# Patient Record
Sex: Female | Born: 1960 | Race: Black or African American | Hispanic: No | Marital: Married | Smoking: Never smoker
Health system: Southern US, Community
[De-identification: ages and names within clinical notes are randomized; demographics above are authoritative.]

## PROBLEM LIST (undated history)

## (undated) DIAGNOSIS — T7840XA Allergy, unspecified, initial encounter: Secondary | ICD-10-CM

## (undated) DIAGNOSIS — M797 Fibromyalgia: Secondary | ICD-10-CM

## (undated) DIAGNOSIS — H269 Unspecified cataract: Secondary | ICD-10-CM

## (undated) DIAGNOSIS — M064 Inflammatory polyarthropathy: Secondary | ICD-10-CM

## (undated) DIAGNOSIS — M810 Age-related osteoporosis without current pathological fracture: Secondary | ICD-10-CM

## (undated) DIAGNOSIS — J45909 Unspecified asthma, uncomplicated: Secondary | ICD-10-CM

## (undated) DIAGNOSIS — E049 Nontoxic goiter, unspecified: Secondary | ICD-10-CM

## (undated) DIAGNOSIS — E039 Hypothyroidism, unspecified: Secondary | ICD-10-CM

## (undated) DIAGNOSIS — E785 Hyperlipidemia, unspecified: Secondary | ICD-10-CM

## (undated) DIAGNOSIS — D649 Anemia, unspecified: Secondary | ICD-10-CM

## (undated) DIAGNOSIS — M546 Pain in thoracic spine: Secondary | ICD-10-CM

## (undated) DIAGNOSIS — K219 Gastro-esophageal reflux disease without esophagitis: Secondary | ICD-10-CM

## (undated) HISTORY — DX: Inflammatory polyarthropathy: M06.4

## (undated) HISTORY — DX: Hypothyroidism, unspecified: E03.9

## (undated) HISTORY — DX: Fibromyalgia: M79.7

## (undated) HISTORY — DX: Gastro-esophageal reflux disease without esophagitis: K21.9

## (undated) HISTORY — DX: Age-related osteoporosis without current pathological fracture: M81.0

## (undated) HISTORY — DX: Allergy, unspecified, initial encounter: T78.40XA

## (undated) HISTORY — DX: Nontoxic goiter, unspecified: E04.9

## (undated) HISTORY — PX: VESICOVAGINAL FISTULA CLOSURE W/ TAH: SUR271

## (undated) HISTORY — DX: Unspecified asthma, uncomplicated: J45.909

## (undated) HISTORY — DX: Anemia, unspecified: D64.9

## (undated) HISTORY — DX: Hyperlipidemia, unspecified: E78.5

## (undated) HISTORY — DX: Unspecified cataract: H26.9

## (undated) HISTORY — PX: VAGINAL HYSTERECTOMY: SHX2639

## (undated) HISTORY — DX: Pain in thoracic spine: M54.6

---

## 2000-08-11 ENCOUNTER — Other Ambulatory Visit: Admission: RE | Admit: 2000-08-11 | Discharge: 2000-08-11 | Payer: Self-pay | Admitting: Obstetrics and Gynecology

## 2000-09-07 ENCOUNTER — Other Ambulatory Visit: Admission: RE | Admit: 2000-09-07 | Discharge: 2000-09-07 | Payer: Self-pay | Admitting: Obstetrics and Gynecology

## 2000-10-26 ENCOUNTER — Observation Stay (HOSPITAL_COMMUNITY): Admission: RE | Admit: 2000-10-26 | Discharge: 2000-10-27 | Payer: Self-pay | Admitting: Obstetrics and Gynecology

## 2011-01-15 ENCOUNTER — Other Ambulatory Visit: Payer: Self-pay | Admitting: Physician Assistant

## 2011-01-15 DIAGNOSIS — M5410 Radiculopathy, site unspecified: Secondary | ICD-10-CM

## 2011-01-15 DIAGNOSIS — M47816 Spondylosis without myelopathy or radiculopathy, lumbar region: Secondary | ICD-10-CM

## 2011-01-15 DIAGNOSIS — M48061 Spinal stenosis, lumbar region without neurogenic claudication: Secondary | ICD-10-CM

## 2011-01-16 ENCOUNTER — Ambulatory Visit
Admission: RE | Admit: 2011-01-16 | Discharge: 2011-01-16 | Disposition: A | Discharge status: Home / Self Care | Payer: Managed Care, Other (non HMO) | Source: Ambulatory Visit | Attending: Physician Assistant | Admitting: Physician Assistant

## 2011-01-16 DIAGNOSIS — M5410 Radiculopathy, site unspecified: Secondary | ICD-10-CM

## 2011-01-16 DIAGNOSIS — M48061 Spinal stenosis, lumbar region without neurogenic claudication: Secondary | ICD-10-CM

## 2011-01-16 DIAGNOSIS — M47816 Spondylosis without myelopathy or radiculopathy, lumbar region: Secondary | ICD-10-CM

## 2011-02-13 ENCOUNTER — Other Ambulatory Visit: Payer: Self-pay | Admitting: Physician Assistant

## 2011-02-13 DIAGNOSIS — M5416 Radiculopathy, lumbar region: Secondary | ICD-10-CM

## 2011-02-13 DIAGNOSIS — M48061 Spinal stenosis, lumbar region without neurogenic claudication: Secondary | ICD-10-CM

## 2011-02-20 ENCOUNTER — Ambulatory Visit
Admission: RE | Admit: 2011-02-20 | Discharge: 2011-02-20 | Disposition: A | Discharge status: Home / Self Care | Payer: Managed Care, Other (non HMO) | Source: Ambulatory Visit | Attending: Physician Assistant | Admitting: Physician Assistant

## 2011-02-20 DIAGNOSIS — M5416 Radiculopathy, lumbar region: Secondary | ICD-10-CM

## 2011-02-20 DIAGNOSIS — M48061 Spinal stenosis, lumbar region without neurogenic claudication: Secondary | ICD-10-CM

## 2011-07-20 ENCOUNTER — Encounter: Payer: Self-pay | Admitting: Internal Medicine

## 2011-07-21 ENCOUNTER — Encounter: Payer: Self-pay | Admitting: Internal Medicine

## 2011-07-21 ENCOUNTER — Ambulatory Visit (INDEPENDENT_AMBULATORY_CARE_PROVIDER_SITE_OTHER): Payer: Managed Care, Other (non HMO) | Admitting: Internal Medicine

## 2011-07-21 VITALS — BP 132/84 | HR 65 | Temp 98.1°F | Ht 69.0 in | Wt 195.2 lb

## 2011-07-21 DIAGNOSIS — R05 Cough: Secondary | ICD-10-CM | POA: Insufficient documentation

## 2011-07-21 DIAGNOSIS — R059 Cough, unspecified: Secondary | ICD-10-CM

## 2011-07-21 MED ORDER — PREDNISONE (PAK) 10 MG PO TABS: ORAL_TABLET | ORAL | Status: AC

## 2011-07-21 MED ORDER — TRAMADOL HCL 50 MG PO TABS: ORAL_TABLET | ORAL | Status: AC

## 2011-07-21 MED ORDER — PANTOPRAZOLE SODIUM 40 MG PO TBEC: 40.0000 mg | DELAYED_RELEASE_TABLET | Freq: Every day | ORAL | Status: DC

## 2011-07-21 MED ORDER — FAMOTIDINE 20 MG PO TABS: ORAL_TABLET | ORAL | Status: DC

## 2011-07-21 NOTE — Assessment & Plan Note (Signed)
The most common causes of chronic cough in immunocompetent adults include the following: upper airway cough syndrome (UACS), previously referred to as postnasal drip syndrome (PNDS), which is caused by variety of rhinosinus conditions; (2) asthma; (3) GERD; (4) chronic bronchitis from cigarette smoking or other inhaled environmental irritants; (5) nonasthmatic eosinophilic bronchitis; and (6) bronchiectasis.   These conditions, singly or in combination, have accounted for up to 94% of the causes of chronic cough in prospective studies.   Other conditions have constituted no >6% of the causes in prospective studies These have included bronchogenic carcinoma, chronic interstitial pneumonia, sarcoidosis, left ventricular failure, ACEI-induced cough, and aspiration from a condition associated with pharyngeal dysfunction.  Of the three most common causes of chronic cough, only one (GERD)  can actually cause the other two (asthma and post nasal drip syndrome)  and perpetuate the cylce of cough inducing airway trauma, inflammation, heightened sensitivity to reflux which is prompted by the cough itself via a cyclical mechanism.    This may partially respond to steroids and look like asthma and post nasal drainage but never erradicated completely unless the cough and the secondary reflux are eliminated, preferably both at the same time.  While not intuitively obvious, many patients with chronic low grade reflux do not cough until there is a secondary insult that disturbs the protective epithelial barrier and exposes sensitive nerve endings.  This can be viral or direct physical injury such as with an endotracheal tube.   The point is that once this occurs, it is difficult to eliminate using anything but a maximally effective acid suppression regimen at least in the short run, accompanied by an appropriate diet to address non acid GERD.   See instructions for specific recommendations which were reviewed directly  with the patient who was given a copy with highlighter outlining the key components.  

## 2011-07-21 NOTE — Patient Instructions (Signed)
The key to effective treatment for your cough is eliminating the non-stop cycle of cough you're stuck in long enough to let your airway heal completely and then see if there is anything still making you cough once you stop the cough suppression, but this should take no more than 5 days to figure out  First take   tramadol 50 mg up to 2 every 4 hours to suppress the urge to cough. Swallowing water or using ice chips/non mint and menthol containing candies (such as lifesavers or sugarless jolly ranchers) are also effective.  You should rest your voice and avoid activities that you know make you cough.  Once you have eliminated the cough for 3 straight days try reducing the tramadol  And stopping  Try protonix 40 mg   Take 30-60 min before first meal of the day and Pepcid 20 mg one bedtime until cough is completely gone for at least a week without the need for cough suppression  I think of reflux for chronic cough like I do oxygen for fire (doesn't cause the fire but once you get the oxygen suppressed it usually goes away regardless of the exact cause).  GERD (REFLUX)  is an extremely common cause of respiratory symptoms, many times with no significant heartburn at all.    It can be treated with medication, but also with lifestyle changes including avoidance of late meals, excessive alcohol, smoking cessation, and avoid fatty foods, chocolate, peppermint, colas, red wine, and acidic juices such as orange juice.  NO MINT OR MENTHOL PRODUCTS SO NO COUGH DROPS  USE SUGARLESS CANDY INSTEAD (jolley ranchers or Stover's)  NO OIL BASED VITAMINS - use powdered substitutes.    Prednisone 10 mg take  4 each am x 2 days,   2 each am x 2 days,  1 each am x2days and stop     If you are satisfied with your treatment plan let your doctor know and he/she can either refill your medications or you can return here when your prescription runs out.     If in any way you are not 100% satisfied,  Please return in 2  weeks   If 100% better, tell your friends!

## 2011-07-21 NOTE — Progress Notes (Signed)
  Subjective:    Patient ID: Donna Hunter, female    DOB: 1961-04-26, 50 y.o.   MRN: [id number]  HPI  42 yobf never smoked with seasonal allergies > spring  X adult life with new cough since 04/2011 refractory to rx as asthma so referred by Dr Veatrice Kells to pulm clinic 07/21/2011   07/21/2011 1s pulmonary ov/ Danen Lapaglia cc abrupt onset like a cold of persistent cough initially prod of green rx with abx and then sev weeks  Prior to day of OV  Changed to a dry starts p stirs around in am, assoc with doe.  Generally Sleeping ok without nocturnal  or early am exacerbation  of respiratory  c/o's or need for noct saba. Also denies any obvious fluctuation of symptoms with weather or environmental changes or other aggravating or alleviating factors except as outlined above   Seems worse at work, out since 3 weeks  Review of Systems  Constitutional: Negative for fever, chills and unexpected weight change.  HENT: Positive for ear pain. Negative for nosebleeds, congestion, sore throat, rhinorrhea, sneezing, trouble swallowing, dental problem, voice change, postnasal drip and sinus pressure.   Eyes: Negative for visual disturbance.  Respiratory: Positive for cough and shortness of breath. Negative for choking.   Cardiovascular: Positive for chest pain. Negative for leg swelling.  Gastrointestinal: Negative for vomiting, abdominal pain and diarrhea.  Genitourinary: Negative for difficulty urinating.  Musculoskeletal: Negative for arthralgias.  Skin: Negative for rash.  Neurological: Negative for tremors, syncope and headaches.  Hematological: Does not bruise/bleed easily.       Objective:   Physical Exam  amb bf with voice fatigue and classic pseudowheeze HEENT: nl dentition, turbinates, and orophanx. Nl external ear canals without cough reflex   NECK :  without JVD/Nodes/TM/ nl carotid upstrokes bilaterally   LUNGS: no acc muscle use, clear to A and P bilaterally without cough on insp or exp  maneuvers   CV:  RRR  no s3 or murmur or increase in P2, no edema   ABD:  soft and nontender with nl excursion in the supine position. No bruits or organomegaly, bowel sounds nl  MS:  warm without deformities, calf tenderness, cyanosis or clubbing  SKIN: warm and dry without lesions    NEURO:  alert, approp, no deficits    cxr 06/30/11 Nl cxr     Assessment & Plan:

## 2012-08-03 ENCOUNTER — Other Ambulatory Visit: Payer: Self-pay | Admitting: Internal Medicine

## 2012-08-31 HISTORY — PX: NECK SURGERY: SHX720

## 2013-08-31 HISTORY — PX: MENISCUS REPAIR: SHX5179

## 2016-09-22 DIAGNOSIS — E890 Postprocedural hypothyroidism: Secondary | ICD-10-CM | POA: Diagnosis not present

## 2016-09-28 DIAGNOSIS — G894 Chronic pain syndrome: Secondary | ICD-10-CM | POA: Diagnosis not present

## 2016-09-28 DIAGNOSIS — M4726 Other spondylosis with radiculopathy, lumbar region: Secondary | ICD-10-CM | POA: Diagnosis not present

## 2016-09-28 DIAGNOSIS — M791 Myalgia: Secondary | ICD-10-CM | POA: Diagnosis not present

## 2016-09-28 DIAGNOSIS — M545 Low back pain: Secondary | ICD-10-CM | POA: Diagnosis not present

## 2016-10-21 DIAGNOSIS — M4726 Other spondylosis with radiculopathy, lumbar region: Secondary | ICD-10-CM | POA: Diagnosis not present

## 2016-10-21 DIAGNOSIS — M545 Low back pain: Secondary | ICD-10-CM | POA: Diagnosis not present

## 2016-10-21 DIAGNOSIS — R262 Difficulty in walking, not elsewhere classified: Secondary | ICD-10-CM | POA: Diagnosis not present

## 2016-10-23 DIAGNOSIS — R262 Difficulty in walking, not elsewhere classified: Secondary | ICD-10-CM | POA: Diagnosis not present

## 2016-10-23 DIAGNOSIS — M545 Low back pain: Secondary | ICD-10-CM | POA: Diagnosis not present

## 2016-10-23 DIAGNOSIS — M4726 Other spondylosis with radiculopathy, lumbar region: Secondary | ICD-10-CM | POA: Diagnosis not present

## 2016-10-26 DIAGNOSIS — R262 Difficulty in walking, not elsewhere classified: Secondary | ICD-10-CM | POA: Diagnosis not present

## 2016-10-26 DIAGNOSIS — M4726 Other spondylosis with radiculopathy, lumbar region: Secondary | ICD-10-CM | POA: Diagnosis not present

## 2016-10-26 DIAGNOSIS — M545 Low back pain: Secondary | ICD-10-CM | POA: Diagnosis not present

## 2016-11-07 DIAGNOSIS — E890 Postprocedural hypothyroidism: Secondary | ICD-10-CM | POA: Diagnosis not present

## 2016-11-12 DIAGNOSIS — R262 Difficulty in walking, not elsewhere classified: Secondary | ICD-10-CM | POA: Diagnosis not present

## 2016-11-12 DIAGNOSIS — M545 Low back pain: Secondary | ICD-10-CM | POA: Diagnosis not present

## 2016-11-12 DIAGNOSIS — M4726 Other spondylosis with radiculopathy, lumbar region: Secondary | ICD-10-CM | POA: Diagnosis not present

## 2016-11-17 DIAGNOSIS — M4726 Other spondylosis with radiculopathy, lumbar region: Secondary | ICD-10-CM | POA: Diagnosis not present

## 2016-11-17 DIAGNOSIS — R262 Difficulty in walking, not elsewhere classified: Secondary | ICD-10-CM | POA: Diagnosis not present

## 2016-11-17 DIAGNOSIS — M545 Low back pain: Secondary | ICD-10-CM | POA: Diagnosis not present

## 2016-11-24 DIAGNOSIS — E890 Postprocedural hypothyroidism: Secondary | ICD-10-CM | POA: Diagnosis not present

## 2016-11-24 DIAGNOSIS — E785 Hyperlipidemia, unspecified: Secondary | ICD-10-CM | POA: Diagnosis not present

## 2016-11-24 DIAGNOSIS — E559 Vitamin D deficiency, unspecified: Secondary | ICD-10-CM | POA: Diagnosis not present

## 2016-11-24 DIAGNOSIS — Z6830 Body mass index (BMI) 30.0-30.9, adult: Secondary | ICD-10-CM | POA: Diagnosis not present

## 2016-11-24 DIAGNOSIS — D519 Vitamin B12 deficiency anemia, unspecified: Secondary | ICD-10-CM | POA: Diagnosis not present

## 2016-11-24 DIAGNOSIS — E1140 Type 2 diabetes mellitus with diabetic neuropathy, unspecified: Secondary | ICD-10-CM | POA: Diagnosis not present

## 2017-02-22 DIAGNOSIS — M26213 Malocclusion, Angle's class III: Secondary | ICD-10-CM | POA: Diagnosis not present

## 2017-02-22 DIAGNOSIS — M791 Myalgia: Secondary | ICD-10-CM | POA: Diagnosis not present

## 2017-02-22 DIAGNOSIS — M26623 Arthralgia of bilateral temporomandibular joint: Secondary | ICD-10-CM | POA: Diagnosis not present

## 2017-02-22 DIAGNOSIS — G4763 Sleep related bruxism: Secondary | ICD-10-CM | POA: Diagnosis not present

## 2017-03-17 DIAGNOSIS — E890 Postprocedural hypothyroidism: Secondary | ICD-10-CM | POA: Diagnosis not present

## 2017-03-26 DIAGNOSIS — I83893 Varicose veins of bilateral lower extremities with other complications: Secondary | ICD-10-CM | POA: Diagnosis not present

## 2017-03-26 DIAGNOSIS — I83813 Varicose veins of bilateral lower extremities with pain: Secondary | ICD-10-CM | POA: Diagnosis not present

## 2017-04-01 DIAGNOSIS — I83813 Varicose veins of bilateral lower extremities with pain: Secondary | ICD-10-CM | POA: Diagnosis not present

## 2017-04-01 DIAGNOSIS — I8311 Varicose veins of right lower extremity with inflammation: Secondary | ICD-10-CM | POA: Diagnosis not present

## 2017-04-01 DIAGNOSIS — I8312 Varicose veins of left lower extremity with inflammation: Secondary | ICD-10-CM | POA: Diagnosis not present

## 2017-04-01 DIAGNOSIS — I83893 Varicose veins of bilateral lower extremities with other complications: Secondary | ICD-10-CM | POA: Diagnosis not present

## 2017-04-09 DIAGNOSIS — Z01419 Encounter for gynecological examination (general) (routine) without abnormal findings: Secondary | ICD-10-CM | POA: Diagnosis not present

## 2017-04-09 DIAGNOSIS — N898 Other specified noninflammatory disorders of vagina: Secondary | ICD-10-CM | POA: Diagnosis not present

## 2017-04-09 DIAGNOSIS — N951 Menopausal and female climacteric states: Secondary | ICD-10-CM | POA: Diagnosis not present

## 2017-04-16 DIAGNOSIS — I83813 Varicose veins of bilateral lower extremities with pain: Secondary | ICD-10-CM | POA: Diagnosis not present

## 2017-06-01 DIAGNOSIS — E785 Hyperlipidemia, unspecified: Secondary | ICD-10-CM | POA: Diagnosis not present

## 2017-06-01 DIAGNOSIS — Z6830 Body mass index (BMI) 30.0-30.9, adult: Secondary | ICD-10-CM | POA: Diagnosis not present

## 2017-06-01 DIAGNOSIS — E559 Vitamin D deficiency, unspecified: Secondary | ICD-10-CM | POA: Diagnosis not present

## 2017-06-01 DIAGNOSIS — E890 Postprocedural hypothyroidism: Secondary | ICD-10-CM | POA: Diagnosis not present

## 2017-06-01 DIAGNOSIS — Z1389 Encounter for screening for other disorder: Secondary | ICD-10-CM | POA: Diagnosis not present

## 2017-06-01 DIAGNOSIS — D519 Vitamin B12 deficiency anemia, unspecified: Secondary | ICD-10-CM | POA: Diagnosis not present

## 2017-06-01 DIAGNOSIS — E1140 Type 2 diabetes mellitus with diabetic neuropathy, unspecified: Secondary | ICD-10-CM | POA: Diagnosis not present

## 2017-06-01 DIAGNOSIS — Z23 Encounter for immunization: Secondary | ICD-10-CM | POA: Diagnosis not present

## 2017-08-06 DIAGNOSIS — J208 Acute bronchitis due to other specified organisms: Secondary | ICD-10-CM | POA: Diagnosis not present

## 2017-08-06 DIAGNOSIS — K5909 Other constipation: Secondary | ICD-10-CM | POA: Diagnosis not present

## 2017-08-06 DIAGNOSIS — M797 Fibromyalgia: Secondary | ICD-10-CM | POA: Diagnosis not present

## 2017-09-17 DIAGNOSIS — E890 Postprocedural hypothyroidism: Secondary | ICD-10-CM | POA: Diagnosis not present

## 2017-10-22 DIAGNOSIS — E890 Postprocedural hypothyroidism: Secondary | ICD-10-CM | POA: Diagnosis not present

## 2017-11-17 DIAGNOSIS — E890 Postprocedural hypothyroidism: Secondary | ICD-10-CM | POA: Diagnosis not present

## 2017-11-23 DIAGNOSIS — Z6829 Body mass index (BMI) 29.0-29.9, adult: Secondary | ICD-10-CM | POA: Diagnosis not present

## 2017-11-23 DIAGNOSIS — K5229 Other allergic and dietetic gastroenteritis and colitis: Secondary | ICD-10-CM | POA: Diagnosis not present

## 2017-11-23 DIAGNOSIS — R220 Localized swelling, mass and lump, head: Secondary | ICD-10-CM | POA: Diagnosis not present

## 2017-11-29 ENCOUNTER — Ambulatory Visit (INDEPENDENT_AMBULATORY_CARE_PROVIDER_SITE_OTHER): Payer: BLUE CROSS/BLUE SHIELD | Admitting: Allergy and Immunology

## 2017-11-29 ENCOUNTER — Encounter: Payer: Self-pay | Admitting: Allergy and Immunology

## 2017-11-29 VITALS — BP 110/74 | HR 52 | Temp 98.3°F | Resp 18 | Ht 67.8 in | Wt 191.6 lb

## 2017-11-29 DIAGNOSIS — J4530 Mild persistent asthma, uncomplicated: Secondary | ICD-10-CM | POA: Diagnosis not present

## 2017-11-29 DIAGNOSIS — K140 Glossitis: Secondary | ICD-10-CM

## 2017-11-29 DIAGNOSIS — Z91018 Allergy to other foods: Secondary | ICD-10-CM | POA: Diagnosis not present

## 2017-11-29 DIAGNOSIS — K219 Gastro-esophageal reflux disease without esophagitis: Secondary | ICD-10-CM

## 2017-11-29 DIAGNOSIS — T7840XA Allergy, unspecified, initial encounter: Secondary | ICD-10-CM | POA: Diagnosis not present

## 2017-11-29 DIAGNOSIS — J3089 Other allergic rhinitis: Secondary | ICD-10-CM

## 2017-11-29 MED ORDER — RANITIDINE HCL 300 MG PO TABS: 300.0000 mg | ORAL_TABLET | Freq: Every day | ORAL | 5 refills | Status: DC

## 2017-11-29 MED ORDER — MONTELUKAST SODIUM 10 MG PO TABS: 10.0000 mg | ORAL_TABLET | Freq: Every day | ORAL | 5 refills | Status: DC

## 2017-11-29 MED ORDER — EPINEPHRINE 0.3 MG/0.3ML IJ SOAJ: INTRAMUSCULAR | 3 refills | Status: AC

## 2017-11-29 MED ORDER — OMEPRAZOLE 40 MG PO CPDR: 40.0000 mg | DELAYED_RELEASE_CAPSULE | ORAL | 5 refills | Status: DC

## 2017-11-29 NOTE — Patient Instructions (Addendum)
  1.  Allergen avoidance measures  2.  Cetirizine 10 mg daily  3.  Treat and prevent reflux:   A.  Omeprazole 40 mg tablet in a.m.  B.  Ranitidine 300 mg tablet in p.m.  C.  Consolidate all caffeine consumption  4.  Treat and prevent reflux:   A.  OTC Nasacort -1 spray each nostril 1 time per day  B.  Montelukast 10 mg -1 tablet 1 time per day  5.  If needed:   A.  EpiPen, Benadryl, MD/ER evaluation for allergic reaction  B.  Pro Air HFA or similar 2 inhalations every 4-6 hours  6.  Review previous blood tests.  Further evaluation?  7.  Drug side effect?  8.  Return to clinic in 4 weeks or earlier if problem

## 2017-11-29 NOTE — Progress Notes (Signed)
Dear Dr. Tomasa Blase,  Thank you for referring Donna Hunter to the [facility] [place] on 11/29/2017.   Below is a summation of this patient's evaluation and recommendations.  Thank you for your referral. I will keep you informed about this patient's response to treatment.   If you have any questions please do not hesitate to contact me.   Sincerely,  Jessica Priest, MD Allergy / Immunology [facility]   ______________________________________________________________________    NEW PATIENT NOTE  Referring Provider: Paulina Fusi, MD Primary Provider: Paulina Fusi, MD Date of office visit: 11/29/2017    Subjective:   Chief Complaint:  Donna Hunter (DOB: Jul 14, 1961) is a 57 y.o. female who presents to the clinic on 11/29/2017 with a chief complaint of Angioedema .     HPI: Donna Hunter presents to this clinic in evaluation of multiple issues.  First, she has apparently had 2 episodes of tongue swelling one occurring approximately 3 weeks ago and one occurring this past Thursday.  She developed red puffy tongue that affected her ability to talk for which she took Benadryl within approximately 15 minutes and went to see her family doctor, Dr. Tomasa Blase, the next day who gave her a prescription for a cream to use on her tongue along with Zantac.  Everything resolved this Sunday.  3 weeks ago she had a similar reaction although not as intense and it responded to Benadryl.  There is no associated systemic or constitutional symptoms with this issue and there is no obvious trigger.  Second, for the past year she has been having "raw tongue".  She has developed a white coat and heat sensitivity and tiny little bumps on the tip of her tongue about twice a week for the past year.  About every 2-3 weeks she sometimes gets nausea associated with this issue.  She does have a history of intermittent nausea as  well that appears to occur every other month or so.  There is no associated systemic or constitutional symptoms with this issue and there is no obvious trigger.  However, she states that when eating salmon she sometimes will get this issue.  Third, she has a history of "asthma" and has seen Dr. Sherene Sires many years ago.  Usually during the spring and fall she will use a short acting bronchodilator 2 times per week for shortness of breath.  She does believe that this bronchodilator helps her to some degree.  Fourth, she has sneezing and nose blowing again during the spring and fall season for which she will occasionally take an antihistamine.  Fifth, she has constant postnasal drip and throat clearing throughout the entire year.  She does have reflux up into her throat even though she continues to use omeprazole on a regular basis.  She has 1 cup of coffee per day and no other forms of caffeine.  Past Medical History:  Diagnosis Date  . Anemia   . Asthma   . Diabetes mellitus without mention of complication   . Fibromyalgia   . GERD (gastroesophageal reflux disease)   . Hypothyroidism   . Thoracic spine pain   . Thyroid goiter   . Unspecified inflammatory polyarthropathy     Past Surgical History:  Procedure Laterality Date  . MENISCUS REPAIR Left 2015  . NECK SURGERY  2014  . VAGINAL HYSTERECTOMY    . VESICOVAGINAL FISTULA CLOSURE W/ TAH  Allergies as of 11/29/2017      Reactions   Shellfish Allergy    Other reaction(s): NO ALLERGY      Medication List      ADVIL COLD/SINUS PO Take by mouth as needed.   alendronate 70 MG tablet Commonly known as:  FOSAMAX Take 70 mg by mouth once a week.   B-12 PO Take by mouth daily.   cyclobenzaprine 10 MG tablet Commonly known as:  FLEXERIL Take 10 mg by mouth as needed.   JANUMET 50-500 MG tablet Generic drug:  sitaGLIPtin-metformin Take 1 tablet by mouth 2 (two) times daily with a meal.   JARDIANCE 25 MG Tabs tablet Generic  drug:  empagliflozin 25 mg daily.   levothyroxine 75 MCG tablet Commonly known as:  SYNTHROID, LEVOTHROID Take 75 mcg by mouth daily.   liothyronine 5 MCG tablet Commonly known as:  CYTOMEL Take 5 mcg by mouth 2 (two) times daily.   meloxicam 15 MG tablet Commonly known as:  MOBIC Take 15 mg by mouth as needed.   PROAIR HFA 108 (90 Base) MCG/ACT inhaler Generic drug:  albuterol Inhale 2 puffs into the lungs every 4 (four) hours as needed for wheezing or shortness of breath.   ranitidine 150 MG tablet Commonly known as:  ZANTAC Take 150 mg by mouth 2 (two) times daily.   triamcinolone 0.1 % paste Commonly known as:  KENALOG   VITAMIN D3 PO Take by mouth at bedtime.   ZYRTEC PO Take 10 mg by mouth daily.       Review of systems negative except as noted in HPI / PMHx or noted below:  Review of Systems  Constitutional: Negative.   HENT: Negative.   Eyes: Negative.   Respiratory: Negative.   Cardiovascular: Negative.   Gastrointestinal: Negative.   Genitourinary: Negative.   Musculoskeletal: Negative.   Skin: Negative.   Neurological: Negative.   Endo/Heme/Allergies: Negative.   Psychiatric/Behavioral: Negative.     Family History  Problem Relation Age of Onset  . Hypertension Mother   . Ovarian cancer Mother   . Hypertension Sister   . Throat cancer Sister   . Asthma Sister     Social History   Socioeconomic History  . Marital status: Married    Spouse name: Not on file  . Number of children: 0  . Years of education: Not on file  . Highest education level: Not on file  Occupational History  . Occupation: Chief of Staff: [employer]    Comment: Works for [employer]  . Financial resource strain: Not on file  . Food insecurity:    Worry: Not on file    Inability: Not on file  . Transportation needs:    Medical: Not on file    Non-medical: Not on file  Tobacco Use  . Smoking status: Never Smoker  . Smokeless  tobacco: Never Used  Substance and Sexual Activity  . Alcohol use: No  . Drug use: No  . Sexual activity: Not on file  Lifestyle  . Physical activity:    Days per week: Not on file    Minutes per session: Not on file  . Stress: Not on file  Relationships  . Social connections:    Talks on phone: Not on file    Gets together: Not on file    Attends religious service: Not on file    Active member of club or organization: Not on file    Attends  meetings of clubs or organizations: Not on file    Relationship status: Not on file  . Intimate partner violence:    Fear of current or ex partner: Not on file    Emotionally abused: Not on file    Physically abused: Not on file    Forced sexual activity: Not on file  Other Topics Concern  . Not on file  Social History Narrative  . Not on file    Environmental and Social history  Lives in a house with a dry environment, no animals located inside the household, carpet in the bedroom, plastic on the bed, plastic on the pillow, and no smokers located inside the household.  Objective:   Vitals:   11/29/17 1000  BP: 110/74  Pulse: (!) 52  Resp: 18  Temp: 98.3 F (36.8 C)   Height: 5' 7.8" (172.2 cm) Weight: 191 lb 9.6 oz (86.9 kg)  Physical Exam  Constitutional: She is well-developed, well-nourished, and in no distress.  HENT:  Head: Normocephalic. Head is without right periorbital erythema and without left periorbital erythema.  Right Ear: Tympanic membrane, external ear and ear canal normal.  Left Ear: Tympanic membrane, external ear and ear canal normal.  Nose: Nose normal. No mucosal edema or rhinorrhea.  Mouth/Throat: Oropharynx is clear and moist and mucous membranes are normal. No oropharyngeal exudate.  Eyes: Pupils are equal, round, and reactive to light. Conjunctivae and lids are normal.  Neck: Trachea normal. No tracheal deviation present. No thyromegaly present.  Cardiovascular: Normal rate, regular rhythm, S1  normal, S2 normal and normal heart sounds.  No murmur heard. Pulmonary/Chest: Effort normal. No stridor. No tachypnea. No respiratory distress. She has no wheezes. She has no rales. She exhibits no tenderness.  Abdominal: Soft. She exhibits no distension and no mass. There is no hepatosplenomegaly. There is no tenderness. There is no rebound and no guarding.  Musculoskeletal: She exhibits no edema or tenderness.  Lymphadenopathy:       Head (right side): No tonsillar adenopathy present.       Head (left side): No tonsillar adenopathy present.    She has no cervical adenopathy.    She has no axillary adenopathy.  Neurological: She is alert. Gait normal.  Skin: No rash noted. She is not diaphoretic. No erythema. No pallor. Nails show no clubbing.  Psychiatric: Mood and affect normal.    Diagnostics: Allergy skin tests were performed.  She demonstrated hypersensitivity to house dust mite.  Spirometry was performed and demonstrated an FEV1 of 3.19 @ 124 % of predicted. FEV1/FVC = 0.90.  Following the administration of nebulized albuterol her FEV1 did not improve.  Assessment and Plan:    1. Asthma, well controlled, mild persistent   2. Other allergic rhinitis   3. Allergic reaction, initial encounter   4. Food allergy   5. LPRD (laryngopharyngeal reflux disease)   6. Glossitis     1.  Allergen avoidance measures  2.  Cetirizine 10 mg daily  3.  Treat and prevent reflux:   A.  Omeprazole 40 mg tablet in a.m.  B.  Ranitidine 300 mg tablet in p.m.  C.  Consolidate all caffeine consumption  4.  Treat and prevent reflux:   A.  OTC Nasacort -1 spray each nostril 1 time per day  B.  Montelukast 10 mg -1 tablet 1 time per day  5.  If needed:   A.  EpiPen, Benadryl, MD/ER evaluation for allergic reaction  B.  Pro Air HFA or similar  2 inhalations every 4-6 hours  6.  Review previous blood tests.  Further evaluation?  7.  Drug side effect?  8.  Return to clinic in 4 weeks or  earlier if problem  Jasmine DecemberSharon does appear to have some atopic disease and we will get her to perform allergen avoidance measures and use anti-inflammatory agents for her upper airways and her lower airway in the form of a nasal steroid and leukotriene modifier.  She also appears to be having recurrent episodes of tongue swelling and what sounds like a low-grade glossitis.  This may be secondary to 1 of the medications that she is using and we may need to work through that issue but before having her eliminate use of any medication I am going to review all the blood tests that have been performed in the past in evaluation of this issue and we will have her utilize a antihistamine on a regular basis.  She appears to have LPR and I have given her a plan to utilize to address that issue as well.  I will regroup with her in 4 weeks or earlier if there is a problem.  Jessica PriestEric J. Zamira Hickam, MD Allergy / Immunology [facility] [place]

## 2017-11-30 ENCOUNTER — Encounter: Payer: Self-pay | Admitting: Allergy and Immunology

## 2017-12-02 DIAGNOSIS — E1140 Type 2 diabetes mellitus with diabetic neuropathy, unspecified: Secondary | ICD-10-CM | POA: Diagnosis not present

## 2017-12-02 DIAGNOSIS — E890 Postprocedural hypothyroidism: Secondary | ICD-10-CM | POA: Diagnosis not present

## 2017-12-02 DIAGNOSIS — Z6829 Body mass index (BMI) 29.0-29.9, adult: Secondary | ICD-10-CM | POA: Diagnosis not present

## 2017-12-02 DIAGNOSIS — E785 Hyperlipidemia, unspecified: Secondary | ICD-10-CM | POA: Diagnosis not present

## 2017-12-02 DIAGNOSIS — D519 Vitamin B12 deficiency anemia, unspecified: Secondary | ICD-10-CM | POA: Diagnosis not present

## 2017-12-02 DIAGNOSIS — Z1231 Encounter for screening mammogram for malignant neoplasm of breast: Secondary | ICD-10-CM | POA: Diagnosis not present

## 2017-12-02 DIAGNOSIS — E559 Vitamin D deficiency, unspecified: Secondary | ICD-10-CM | POA: Diagnosis not present

## 2017-12-27 ENCOUNTER — Encounter: Payer: Self-pay | Admitting: Allergy and Immunology

## 2017-12-27 ENCOUNTER — Ambulatory Visit (INDEPENDENT_AMBULATORY_CARE_PROVIDER_SITE_OTHER): Payer: BLUE CROSS/BLUE SHIELD | Admitting: Allergy and Immunology

## 2017-12-27 VITALS — BP 118/68 | HR 60 | Resp 18

## 2017-12-27 DIAGNOSIS — J3089 Other allergic rhinitis: Secondary | ICD-10-CM | POA: Diagnosis not present

## 2017-12-27 DIAGNOSIS — K219 Gastro-esophageal reflux disease without esophagitis: Secondary | ICD-10-CM | POA: Diagnosis not present

## 2017-12-27 DIAGNOSIS — T7840XD Allergy, unspecified, subsequent encounter: Secondary | ICD-10-CM

## 2017-12-27 DIAGNOSIS — K140 Glossitis: Secondary | ICD-10-CM | POA: Diagnosis not present

## 2017-12-27 DIAGNOSIS — J4530 Mild persistent asthma, uncomplicated: Secondary | ICD-10-CM

## 2017-12-27 DIAGNOSIS — Z1231 Encounter for screening mammogram for malignant neoplasm of breast: Secondary | ICD-10-CM | POA: Diagnosis not present

## 2017-12-27 NOTE — Progress Notes (Signed)
Follow-up Note  Referring Provider: Paulina Fusi, MD Primary Provider: Paulina Fusi, MD Date of Office Visit: 12/27/2017  Subjective:   Donna Hunter (DOB: May 05, 1961) is a 57 y.o. female who returns to the [facility] on 12/27/2017 in re-evaluation of the following:  HPI: Morning returns to this clinic in reevaluation of asthma and allergic rhinitis and allergic reaction and glossitis and LPR.  Her last visit to this clinic was her initial evaluation of 29 November 2017 at which point in time we made a plan to address each issue.  She is really doing much better regarding her respiratory tract.  She has no issues with asthma and does not need to use a short acting bronchodilator and her nose has improved significantly.  She has performed house dust avoidance measures.  She is using a nasal steroid but is no longer using montelukast as she found montelukast to be too drying.  Her throat has improved significantly.  All of her constant postnasal drip and throat clearing has basically resolved.  She no longer has any reflux.  She has decreased her coffee consumption by 50% and continues on omeprazole and ranitidine.  Her tongue is doing much better at this point in time.  Her sensation of "raw tongue" has really improved significantly and she does not notice this issue or any swelling or tiny bumps developing on her tongue.  If she eats very spicy food sometimes her tongue will get a little bit irritated.  Allergies as of 12/27/2017      Reactions   Shellfish Allergy    Other reaction(s): NO ALLERGY      Medication List      ADVIL COLD/SINUS PO Take by mouth as needed.   alendronate 70 MG tablet Commonly known as:  FOSAMAX Take 70 mg by mouth once a week.   B-12 PO Take by mouth daily.   cyclobenzaprine 10 MG tablet Commonly known as:  FLEXERIL Take 10 mg by mouth as needed.   EPINEPHrine 0.3 mg/0.3 mL Soaj injection Commonly known as:   EPI-PEN Use as directed for life-threatening allergic reaction.   JANUMET 50-500 MG tablet Generic drug:  sitaGLIPtin-metformin Take 1 tablet by mouth 2 (two) times daily with a meal.   JARDIANCE 25 MG Tabs tablet Generic drug:  empagliflozin 25 mg daily.   levothyroxine 75 MCG tablet Commonly known as:  SYNTHROID, LEVOTHROID Take 75 mcg by mouth daily.   liothyronine 5 MCG tablet Commonly known as:  CYTOMEL Take 5 mcg by mouth 2 (two) times daily.   meloxicam 15 MG tablet Commonly known as:  MOBIC Take 15 mg by mouth as needed.   montelukast 10 MG tablet Commonly known as:  SINGULAIR Take 1 tablet (10 mg total) by mouth at bedtime.   omeprazole 40 MG capsule Commonly known as:  PRILOSEC Take 1 capsule (40 mg total) by mouth every morning.   PROAIR HFA 108 (90 Base) MCG/ACT inhaler Generic drug:  albuterol Inhale 2 puffs into the lungs every 4 (four) hours as needed for wheezing or shortness of breath.   ranitidine 300 MG tablet Commonly known as:  ZANTAC Take 1 tablet (300 mg total) by mouth at bedtime.   triamcinolone 0.1 % paste Commonly known as:  KENALOG   VITAMIN D3 PO Take by mouth at bedtime.   ZYRTEC PO Take 10 mg by mouth daily.       Past Medical History:  Diagnosis Date  . Anemia   .  Asthma   . Diabetes mellitus without mention of complication   . Fibromyalgia   . GERD (gastroesophageal reflux disease)   . Hypothyroidism   . Thoracic spine pain   . Thyroid goiter   . Unspecified inflammatory polyarthropathy     Past Surgical History:  Procedure Laterality Date  . MENISCUS REPAIR Left 2015  . NECK SURGERY  2014  . VAGINAL HYSTERECTOMY    . VESICOVAGINAL FISTULA CLOSURE W/ TAH      Review of systems negative except as noted in HPI / PMHx or noted below:  Review of Systems  Constitutional: Negative.   HENT: Negative.   Eyes: Negative.   Respiratory: Negative.   Cardiovascular: Negative.   Gastrointestinal: Negative.    Genitourinary: Negative.   Musculoskeletal: Negative.   Skin: Negative.   Neurological: Negative.   Endo/Heme/Allergies: Negative.   Psychiatric/Behavioral: Negative.      Objective:   Vitals:   12/27/17 1131  BP: 118/68  Pulse: 60  Resp: 18          Physical Exam  HENT:  Head: Normocephalic.  Right Ear: Tympanic membrane, external ear and ear canal normal.  Left Ear: Tympanic membrane, external ear and ear canal normal.  Nose: Nose normal. No mucosal edema or rhinorrhea.  Mouth/Throat: Uvula is midline, oropharynx is clear and moist and mucous membranes are normal. No oropharyngeal exudate.  Eyes: Conjunctivae are normal.  Neck: Trachea normal. No tracheal tenderness present. No tracheal deviation present. No thyromegaly present.  Cardiovascular: Normal rate, regular rhythm, S1 normal, S2 normal and normal heart sounds.  No murmur heard. Pulmonary/Chest: Breath sounds normal. No stridor. No respiratory distress. She has no wheezes. She has no rales.  Musculoskeletal: She exhibits no edema.  Lymphadenopathy:       Head (right side): No tonsillar adenopathy present.       Head (left side): No tonsillar adenopathy present.    She has no cervical adenopathy.  Neurological: She is alert.  Skin: No rash noted. She is not diaphoretic. No erythema. Nails show no clubbing.    Diagnostics:    Spirometry was performed and demonstrated an FEV1 of 2.88 at 112 % of predicted.  The patient had an Asthma Control Test with the following results: ACT Total Score: 19.    Assessment and Plan:   1. Asthma, well controlled, mild persistent   2. Other allergic rhinitis   3. Allergic reaction, subsequent encounter   4. LPRD (laryngopharyngeal reflux disease)   5. Glossitis     1.  Continue cetirizine 10 mg daily  2.  Continue to treat and prevent reflux:   A.  Omeprazole 40 mg tablet in a.m.  B.  Ranitidine 300 mg tablet in p.m.  C.  Consolidate all caffeine consumption  3.   Continue to treat and prevent inflammation:   A.  OTC Nasacort -1 spray each nostril 1 time per day  4.  If needed:   A.  EpiPen, Benadryl, MD/ER evaluation for allergic reaction  B.  Pro Air HFA or similar 2 inhalations every 4-6 hours   5. Return in 12 weeks or earlier if problem. Taper?  Overall Donna Hunter has really had good improvement regarding all of her issues and she will continue to utilize the therapy noted above directed against reflux and inflammation and I will see her back in this clinic in 12 weeks.  At that point in time there will probably be an opportunity to consolidate her treatment.  Laurette Schimke, MD Allergy /  Immunology [facility]

## 2017-12-27 NOTE — Patient Instructions (Addendum)
  1.  Continue cetirizine 10 mg daily  2.  Continue to treat and prevent reflux:   A.  Omeprazole 40 mg tablet in a.m.  B.  Ranitidine 300 mg tablet in p.m.  C.  Consolidate all caffeine consumption  3.  Continue to treat and prevent inflammation:   A.  OTC Nasacort -1 spray each nostril 1 time per day  4.  If needed:   A.  EpiPen, Benadryl, MD/ER evaluation for allergic reaction  B.  Pro Air HFA or similar 2 inhalations every 4-6 hours   5. Return in 12 weeks or earlier if problem. Taper?

## 2017-12-28 ENCOUNTER — Encounter: Payer: Self-pay | Admitting: Allergy and Immunology

## 2018-03-18 DIAGNOSIS — M25552 Pain in left hip: Secondary | ICD-10-CM | POA: Diagnosis not present

## 2018-03-21 DIAGNOSIS — M7062 Trochanteric bursitis, left hip: Secondary | ICD-10-CM | POA: Diagnosis not present

## 2018-03-22 DIAGNOSIS — E119 Type 2 diabetes mellitus without complications: Secondary | ICD-10-CM | POA: Diagnosis not present

## 2018-03-22 DIAGNOSIS — E890 Postprocedural hypothyroidism: Secondary | ICD-10-CM | POA: Diagnosis not present

## 2018-03-28 ENCOUNTER — Ambulatory Visit: Payer: Medicare Other | Admitting: Allergy and Immunology

## 2018-05-10 DIAGNOSIS — R252 Cramp and spasm: Secondary | ICD-10-CM | POA: Diagnosis not present

## 2018-05-10 DIAGNOSIS — Z6829 Body mass index (BMI) 29.0-29.9, adult: Secondary | ICD-10-CM | POA: Diagnosis not present

## 2018-05-12 DIAGNOSIS — M7062 Trochanteric bursitis, left hip: Secondary | ICD-10-CM | POA: Diagnosis not present

## 2018-05-16 DIAGNOSIS — R262 Difficulty in walking, not elsewhere classified: Secondary | ICD-10-CM | POA: Diagnosis not present

## 2018-05-16 DIAGNOSIS — M6281 Muscle weakness (generalized): Secondary | ICD-10-CM | POA: Diagnosis not present

## 2018-05-16 DIAGNOSIS — M25552 Pain in left hip: Secondary | ICD-10-CM | POA: Diagnosis not present

## 2018-05-16 DIAGNOSIS — M5116 Intervertebral disc disorders with radiculopathy, lumbar region: Secondary | ICD-10-CM | POA: Diagnosis not present

## 2018-05-19 DIAGNOSIS — R262 Difficulty in walking, not elsewhere classified: Secondary | ICD-10-CM | POA: Diagnosis not present

## 2018-05-19 DIAGNOSIS — M5116 Intervertebral disc disorders with radiculopathy, lumbar region: Secondary | ICD-10-CM | POA: Diagnosis not present

## 2018-05-19 DIAGNOSIS — M6281 Muscle weakness (generalized): Secondary | ICD-10-CM | POA: Diagnosis not present

## 2018-05-19 DIAGNOSIS — M25552 Pain in left hip: Secondary | ICD-10-CM | POA: Diagnosis not present

## 2018-05-24 DIAGNOSIS — M25552 Pain in left hip: Secondary | ICD-10-CM | POA: Diagnosis not present

## 2018-05-24 DIAGNOSIS — R262 Difficulty in walking, not elsewhere classified: Secondary | ICD-10-CM | POA: Diagnosis not present

## 2018-05-24 DIAGNOSIS — M6281 Muscle weakness (generalized): Secondary | ICD-10-CM | POA: Diagnosis not present

## 2018-05-24 DIAGNOSIS — M5116 Intervertebral disc disorders with radiculopathy, lumbar region: Secondary | ICD-10-CM | POA: Diagnosis not present

## 2018-05-26 DIAGNOSIS — M5116 Intervertebral disc disorders with radiculopathy, lumbar region: Secondary | ICD-10-CM | POA: Diagnosis not present

## 2018-05-26 DIAGNOSIS — M25552 Pain in left hip: Secondary | ICD-10-CM | POA: Diagnosis not present

## 2018-05-26 DIAGNOSIS — M6281 Muscle weakness (generalized): Secondary | ICD-10-CM | POA: Diagnosis not present

## 2018-05-26 DIAGNOSIS — R262 Difficulty in walking, not elsewhere classified: Secondary | ICD-10-CM | POA: Diagnosis not present

## 2018-05-31 DIAGNOSIS — R262 Difficulty in walking, not elsewhere classified: Secondary | ICD-10-CM | POA: Diagnosis not present

## 2018-05-31 DIAGNOSIS — M5116 Intervertebral disc disorders with radiculopathy, lumbar region: Secondary | ICD-10-CM | POA: Diagnosis not present

## 2018-05-31 DIAGNOSIS — M25552 Pain in left hip: Secondary | ICD-10-CM | POA: Diagnosis not present

## 2018-05-31 DIAGNOSIS — M6281 Muscle weakness (generalized): Secondary | ICD-10-CM | POA: Diagnosis not present

## 2018-06-06 ENCOUNTER — Other Ambulatory Visit: Payer: Self-pay | Admitting: *Deleted

## 2018-06-06 MED ORDER — FAMOTIDINE 40 MG PO TABS: ORAL_TABLET | ORAL | 0 refills | Status: AC

## 2018-06-07 DIAGNOSIS — M5116 Intervertebral disc disorders with radiculopathy, lumbar region: Secondary | ICD-10-CM | POA: Diagnosis not present

## 2018-06-07 DIAGNOSIS — R262 Difficulty in walking, not elsewhere classified: Secondary | ICD-10-CM | POA: Diagnosis not present

## 2018-06-07 DIAGNOSIS — M6281 Muscle weakness (generalized): Secondary | ICD-10-CM | POA: Diagnosis not present

## 2018-06-07 DIAGNOSIS — M25552 Pain in left hip: Secondary | ICD-10-CM | POA: Diagnosis not present

## 2018-06-09 DIAGNOSIS — M5116 Intervertebral disc disorders with radiculopathy, lumbar region: Secondary | ICD-10-CM | POA: Diagnosis not present

## 2018-06-09 DIAGNOSIS — M25552 Pain in left hip: Secondary | ICD-10-CM | POA: Diagnosis not present

## 2018-06-09 DIAGNOSIS — M6281 Muscle weakness (generalized): Secondary | ICD-10-CM | POA: Diagnosis not present

## 2018-06-09 DIAGNOSIS — R262 Difficulty in walking, not elsewhere classified: Secondary | ICD-10-CM | POA: Diagnosis not present

## 2018-06-13 ENCOUNTER — Ambulatory Visit (INDEPENDENT_AMBULATORY_CARE_PROVIDER_SITE_OTHER): Payer: BLUE CROSS/BLUE SHIELD | Admitting: Allergy and Immunology

## 2018-06-13 ENCOUNTER — Encounter: Payer: Self-pay | Admitting: Allergy and Immunology

## 2018-06-13 VITALS — BP 108/60 | HR 74 | Resp 16

## 2018-06-13 DIAGNOSIS — E559 Vitamin D deficiency, unspecified: Secondary | ICD-10-CM | POA: Diagnosis not present

## 2018-06-13 DIAGNOSIS — J4530 Mild persistent asthma, uncomplicated: Secondary | ICD-10-CM | POA: Diagnosis not present

## 2018-06-13 DIAGNOSIS — T7840XD Allergy, unspecified, subsequent encounter: Secondary | ICD-10-CM | POA: Diagnosis not present

## 2018-06-13 DIAGNOSIS — G43909 Migraine, unspecified, not intractable, without status migrainosus: Secondary | ICD-10-CM | POA: Diagnosis not present

## 2018-06-13 DIAGNOSIS — Z23 Encounter for immunization: Secondary | ICD-10-CM | POA: Diagnosis not present

## 2018-06-13 DIAGNOSIS — G478 Other sleep disorders: Secondary | ICD-10-CM | POA: Diagnosis not present

## 2018-06-13 DIAGNOSIS — J3089 Other allergic rhinitis: Secondary | ICD-10-CM | POA: Diagnosis not present

## 2018-06-13 DIAGNOSIS — K219 Gastro-esophageal reflux disease without esophagitis: Secondary | ICD-10-CM | POA: Diagnosis not present

## 2018-06-13 DIAGNOSIS — K140 Glossitis: Secondary | ICD-10-CM

## 2018-06-13 DIAGNOSIS — E785 Hyperlipidemia, unspecified: Secondary | ICD-10-CM | POA: Diagnosis not present

## 2018-06-13 DIAGNOSIS — E890 Postprocedural hypothyroidism: Secondary | ICD-10-CM | POA: Diagnosis not present

## 2018-06-13 DIAGNOSIS — D519 Vitamin B12 deficiency anemia, unspecified: Secondary | ICD-10-CM | POA: Diagnosis not present

## 2018-06-13 DIAGNOSIS — E1140 Type 2 diabetes mellitus with diabetic neuropathy, unspecified: Secondary | ICD-10-CM | POA: Diagnosis not present

## 2018-06-13 MED ORDER — CYPROHEPTADINE HCL 4 MG PO TABS: ORAL_TABLET | ORAL | 5 refills | Status: AC

## 2018-06-13 NOTE — Progress Notes (Signed)
Follow-up Note  Referring Provider: Paulina Fusi, MD Primary Provider: Paulina Fusi, MD Date of Office Visit: 06/13/2018  Subjective:   Donna Hunter (DOB: 05/11/1961) is a 57 y.o. female who [facility] in re-evaluation of the following:  HPI: [name] returns to this clinic in reevaluation of her asthma and allergic rhinitis and a history of allergic reaction and issues with LPR and glossitis.  Her last visit to this clinic was 27 December 2017.  She has done very well regarding her respiratory tract.  Her requirement for short acting bronchodilator is 1 or 2 times per week.  She has had very little issues with her nose.  She has not required a systemic steroid or antibiotic to treat any type of respiratory tract issue.  Likewise, her throat is really doing quite well and she has very little issue concerning reflux.  She has eliminated all caffeine consumption.  She continues on omeprazole and recently discontinued her ranitidine.  Since she discontinued her ranitidine she has noticed an irritation of her mouth as she had previously that had completely come under control with aggressive therapy directed against reflux.  She relates a history of having headaches.  For about the past year or so she has had 2 headaches per month that last a few days and are usually in a unilateral temporal location and are associated with throbbing and sometimes radiates to the back of her neck and cause her to lay down for relief.  As well, she has very fractured sleep.  She will go to sleep around 10 or so and then she wakes up at 1 am and then she is up and down all night.  She does not remember waking up gasping or choking or not breathing correctly.  She is not sure why she can not go back to sleep.  She has not had any allergic reactions.  She remains away from eating shellfish.  Allergies as of 06/13/2018      Reactions   Shellfish Allergy    Other reaction(s): NO ALLERGY      Medication List      alendronate 70 MG tablet Commonly known as:  FOSAMAX Take 70 mg by mouth once a week.   B-12 PO Take by mouth daily.   cyclobenzaprine 10 MG tablet Commonly known as:  FLEXERIL Take 10 mg by mouth as needed.   EPINEPHrine 0.3 mg/0.3 mL Soaj injection Commonly known as:  EPI-PEN Use as directed for life-threatening allergic reaction.   famotidine 40 MG tablet Commonly known as:  PEPCID Take one tablet at bedtime as directed   JANUMET 50-500 MG tablet Generic drug:  sitaGLIPtin-metformin Take 1 tablet by mouth 2 (two) times daily with a meal.   JARDIANCE 25 MG Tabs tablet Generic drug:  empagliflozin 25 mg daily.   levothyroxine 75 MCG tablet Commonly known as:  SYNTHROID, LEVOTHROID Take 75 mcg by mouth daily.   liothyronine 5 MCG tablet Commonly known as:  CYTOMEL Take 5 mcg by mouth 2 (two) times daily.   meloxicam 15 MG tablet Commonly known as:  MOBIC Take 15 mg by mouth as needed.   montelukast 10 MG tablet Commonly known as:  SINGULAIR Take 1 tablet (10 mg total) by mouth at bedtime.   omeprazole 40 MG capsule Commonly known as:  PRILOSEC Take 1 capsule (40 mg total) by mouth every morning.   pravastatin 20 MG tablet Commonly known as:  PRAVACHOL   PROAIR HFA 108 (90 Base) MCG/ACT inhaler Generic drug:  albuterol Inhale 2 puffs into the lungs every 4 (four) hours as needed for wheezing or shortness of breath.   triamcinolone 0.1 % paste Commonly known as:  KENALOG   VITAMIN D3 PO Take by mouth at bedtime.   ZYRTEC PO Take 10 mg by mouth daily.       Past Medical History:  Diagnosis Date  . Anemia   . Asthma   . Diabetes mellitus without mention of complication   . Fibromyalgia   . GERD (gastroesophageal reflux disease)   . Hypothyroidism   . Thoracic spine pain   . Thyroid goiter   . Unspecified inflammatory polyarthropathy     Past Surgical History:  Procedure Laterality  Date  . MENISCUS REPAIR Left 2015  . NECK SURGERY  2014  . VAGINAL HYSTERECTOMY    . VESICOVAGINAL FISTULA CLOSURE W/ TAH      Review of systems negative except as noted in HPI / PMHx or noted below:  Review of Systems  Constitutional: Negative.   HENT: Negative.   Eyes: Negative.   Respiratory: Negative.   Cardiovascular: Negative.   Gastrointestinal: Negative.   Genitourinary: Negative.   Musculoskeletal: Negative.   Skin: Negative.   Neurological: Negative.   Endo/Heme/Allergies: Negative.   Psychiatric/Behavioral: Negative.      Objective:   Vitals:   06/13/18 1513  BP: 108/60  Pulse: 74  Resp: 16          Physical Exam  HENT:  Head: Normocephalic.  Right Ear: Tympanic membrane, external ear and ear canal normal.  Left Ear: Tympanic membrane, external ear and ear canal normal.  Nose: Nose normal. No mucosal edema or rhinorrhea.  Mouth/Throat: Uvula is midline, oropharynx is clear and moist and mucous membranes are normal. No oropharyngeal exudate.  Eyes: Conjunctivae are normal.  Neck: Trachea normal. No tracheal tenderness present. No tracheal deviation present. No thyromegaly present.  Cardiovascular: Normal rate, regular rhythm, S1 normal, S2 normal and normal heart sounds.  No murmur heard. Pulmonary/Chest: Breath sounds normal. No stridor. No respiratory distress. She has no wheezes. She has no rales.  Musculoskeletal: She exhibits no edema.  Lymphadenopathy:       Head (right side): No tonsillar adenopathy present.       Head (left side): No tonsillar adenopathy present.    She has no cervical adenopathy.  Neurological: She is alert.  Skin: No rash noted. She is not diaphoretic. No erythema. Nails show no clubbing.    Diagnostics:    Spirometry was performed and demonstrated an FEV1 of 2.88 at 113 % of predicted.  The patient had an Asthma Control Test with the following results: ACT Total Score: 14.    Assessment and Plan:   1. Asthma, well  controlled, mild persistent   2. Other allergic rhinitis   3. Allergic reaction, subsequent encounter   4. LPRD (laryngopharyngeal reflux disease)   5. Glossitis   6. Migraine syndrome   7. Sleep dysfunction with sleep stage disturbance     1.  Continue cetirizine 10 mg daily  2.  Continue to treat and prevent reflux:   A.  Omeprazole 40 mg tablet in a.m.  B.  Ranitidine 300 mg or famotidine 40 mg tablet in p.m.  3.  Continue to treat and prevent inflammation:   A.  OTC Nasacort -1 spray each nostril 1 time per day  4. Treat and prevent headache and sleep dysfunction:  A.  Periactin 4 mg tablet -1/2 to 1 tablet at bedtime  5.  If needed:   A.  EpiPen, Benadryl, MD/ER evaluation for allergic reaction  B.  Pro Air HFA or similar 2 inhalations every 4-6 hours  6. Return in 12 weeks or earlier if problem.    7. Obtain fall flu vaccine  Lorna appears to be doing relatively well with her airway on her current anti-inflammatory medications.  It is interesting to note that her glossitis flared up when she discontinued her ranitidine and she probably needs to use either ranitidine or famotidine on a regular basis in addition to her omeprazole to treat this issue as well as to treat her LPR.  I am going to start her on Periactin at bedtime to address her sleep dysfunction and headaches.  She will keep in contact with me noting her response to 2 mg to 4 mg at bedtime.  I will see her back in his clinic in 12 weeks or earlier if there is a problem.  Laurette Schimke, MD Allergy / Immunology [facility]

## 2018-06-13 NOTE — Patient Instructions (Addendum)
  1.  Continue cetirizine 10 mg daily  2.  Continue to treat and prevent reflux:   A.  Omeprazole 40 mg tablet in a.m.  B.  Ranitidine 300 mg or famotidine 40 mg tablet in p.m.  3.  Continue to treat and prevent inflammation:   A.  OTC Nasacort -1 spray each nostril 1 time per day  4. Treat and prevent headache and sleep dysfunction:   A.  Periactin 4 mg tablet -1/2 to 1 tablet at bedtime  5.  If needed:   A.  EpiPen, Benadryl, MD/ER evaluation for allergic reaction  B.  Pro Air HFA or similar 2 inhalations every 4-6 hours  6. Return in 12 weeks or earlier if problem.    7. Obtain fall flu vaccine

## 2018-06-14 ENCOUNTER — Encounter: Payer: Self-pay | Admitting: Allergy and Immunology

## 2018-06-17 ENCOUNTER — Encounter: Payer: Self-pay | Admitting: *Deleted

## 2018-06-21 DIAGNOSIS — M6281 Muscle weakness (generalized): Secondary | ICD-10-CM | POA: Diagnosis not present

## 2018-06-21 DIAGNOSIS — M5116 Intervertebral disc disorders with radiculopathy, lumbar region: Secondary | ICD-10-CM | POA: Diagnosis not present

## 2018-06-21 DIAGNOSIS — M25552 Pain in left hip: Secondary | ICD-10-CM | POA: Diagnosis not present

## 2018-06-21 DIAGNOSIS — R262 Difficulty in walking, not elsewhere classified: Secondary | ICD-10-CM | POA: Diagnosis not present

## 2018-07-04 DIAGNOSIS — J208 Acute bronchitis due to other specified organisms: Secondary | ICD-10-CM | POA: Diagnosis not present

## 2018-07-14 DIAGNOSIS — M6281 Muscle weakness (generalized): Secondary | ICD-10-CM | POA: Diagnosis not present

## 2018-07-14 DIAGNOSIS — R262 Difficulty in walking, not elsewhere classified: Secondary | ICD-10-CM | POA: Diagnosis not present

## 2018-07-14 DIAGNOSIS — M5116 Intervertebral disc disorders with radiculopathy, lumbar region: Secondary | ICD-10-CM | POA: Diagnosis not present

## 2018-07-14 DIAGNOSIS — M25552 Pain in left hip: Secondary | ICD-10-CM | POA: Diagnosis not present

## 2018-07-27 DIAGNOSIS — M6281 Muscle weakness (generalized): Secondary | ICD-10-CM | POA: Diagnosis not present

## 2018-07-27 DIAGNOSIS — M25552 Pain in left hip: Secondary | ICD-10-CM | POA: Diagnosis not present

## 2018-07-27 DIAGNOSIS — R262 Difficulty in walking, not elsewhere classified: Secondary | ICD-10-CM | POA: Diagnosis not present

## 2018-07-27 DIAGNOSIS — M5116 Intervertebral disc disorders with radiculopathy, lumbar region: Secondary | ICD-10-CM | POA: Diagnosis not present

## 2018-08-02 DIAGNOSIS — M5116 Intervertebral disc disorders with radiculopathy, lumbar region: Secondary | ICD-10-CM | POA: Diagnosis not present

## 2018-08-02 DIAGNOSIS — M25552 Pain in left hip: Secondary | ICD-10-CM | POA: Diagnosis not present

## 2018-08-02 DIAGNOSIS — R262 Difficulty in walking, not elsewhere classified: Secondary | ICD-10-CM | POA: Diagnosis not present

## 2018-08-02 DIAGNOSIS — M6281 Muscle weakness (generalized): Secondary | ICD-10-CM | POA: Diagnosis not present

## 2018-08-11 DIAGNOSIS — M6281 Muscle weakness (generalized): Secondary | ICD-10-CM | POA: Diagnosis not present

## 2018-08-11 DIAGNOSIS — R262 Difficulty in walking, not elsewhere classified: Secondary | ICD-10-CM | POA: Diagnosis not present

## 2018-08-11 DIAGNOSIS — M25552 Pain in left hip: Secondary | ICD-10-CM | POA: Diagnosis not present

## 2018-08-11 DIAGNOSIS — M5116 Intervertebral disc disorders with radiculopathy, lumbar region: Secondary | ICD-10-CM | POA: Diagnosis not present

## 2018-09-05 ENCOUNTER — Ambulatory Visit: Payer: Medicare Other | Admitting: Allergy and Immunology

## 2018-09-27 ENCOUNTER — Encounter: Payer: Self-pay | Admitting: *Deleted

## 2018-09-30 DIAGNOSIS — M25551 Pain in right hip: Secondary | ICD-10-CM | POA: Diagnosis not present

## 2018-09-30 DIAGNOSIS — M546 Pain in thoracic spine: Secondary | ICD-10-CM | POA: Diagnosis not present

## 2018-10-18 DIAGNOSIS — E890 Postprocedural hypothyroidism: Secondary | ICD-10-CM | POA: Diagnosis not present

## 2018-10-18 DIAGNOSIS — E119 Type 2 diabetes mellitus without complications: Secondary | ICD-10-CM | POA: Diagnosis not present

## 2018-10-18 DIAGNOSIS — Z794 Long term (current) use of insulin: Secondary | ICD-10-CM | POA: Diagnosis not present

## 2018-10-20 ENCOUNTER — Other Ambulatory Visit: Payer: Self-pay | Admitting: Allergy and Immunology

## 2019-03-02 DIAGNOSIS — E559 Vitamin D deficiency, unspecified: Secondary | ICD-10-CM | POA: Diagnosis not present

## 2019-03-02 DIAGNOSIS — E890 Postprocedural hypothyroidism: Secondary | ICD-10-CM | POA: Diagnosis not present

## 2019-03-02 DIAGNOSIS — E1140 Type 2 diabetes mellitus with diabetic neuropathy, unspecified: Secondary | ICD-10-CM | POA: Diagnosis not present

## 2019-03-02 DIAGNOSIS — D519 Vitamin B12 deficiency anemia, unspecified: Secondary | ICD-10-CM | POA: Diagnosis not present

## 2019-03-02 DIAGNOSIS — E785 Hyperlipidemia, unspecified: Secondary | ICD-10-CM | POA: Diagnosis not present

## 2019-03-02 DIAGNOSIS — E1165 Type 2 diabetes mellitus with hyperglycemia: Secondary | ICD-10-CM | POA: Diagnosis not present

## 2019-06-02 DIAGNOSIS — E1142 Type 2 diabetes mellitus with diabetic polyneuropathy: Secondary | ICD-10-CM | POA: Diagnosis not present

## 2019-06-02 DIAGNOSIS — E559 Vitamin D deficiency, unspecified: Secondary | ICD-10-CM | POA: Diagnosis not present

## 2019-06-02 DIAGNOSIS — E890 Postprocedural hypothyroidism: Secondary | ICD-10-CM | POA: Diagnosis not present

## 2019-06-02 DIAGNOSIS — D519 Vitamin B12 deficiency anemia, unspecified: Secondary | ICD-10-CM | POA: Diagnosis not present

## 2019-06-02 DIAGNOSIS — E1165 Type 2 diabetes mellitus with hyperglycemia: Secondary | ICD-10-CM | POA: Diagnosis not present

## 2019-06-02 DIAGNOSIS — E785 Hyperlipidemia, unspecified: Secondary | ICD-10-CM | POA: Diagnosis not present

## 2019-06-08 DIAGNOSIS — E119 Type 2 diabetes mellitus without complications: Secondary | ICD-10-CM | POA: Diagnosis not present

## 2019-06-08 DIAGNOSIS — E890 Postprocedural hypothyroidism: Secondary | ICD-10-CM | POA: Diagnosis not present

## 2019-06-08 DIAGNOSIS — E559 Vitamin D deficiency, unspecified: Secondary | ICD-10-CM | POA: Diagnosis not present

## 2019-06-24 DIAGNOSIS — Z1231 Encounter for screening mammogram for malignant neoplasm of breast: Secondary | ICD-10-CM | POA: Diagnosis not present

## 2019-09-04 DIAGNOSIS — E1142 Type 2 diabetes mellitus with diabetic polyneuropathy: Secondary | ICD-10-CM | POA: Diagnosis not present

## 2019-09-04 DIAGNOSIS — D519 Vitamin B12 deficiency anemia, unspecified: Secondary | ICD-10-CM | POA: Diagnosis not present

## 2019-09-04 DIAGNOSIS — E890 Postprocedural hypothyroidism: Secondary | ICD-10-CM | POA: Diagnosis not present

## 2019-09-04 DIAGNOSIS — E559 Vitamin D deficiency, unspecified: Secondary | ICD-10-CM | POA: Diagnosis not present

## 2019-09-04 DIAGNOSIS — E785 Hyperlipidemia, unspecified: Secondary | ICD-10-CM | POA: Diagnosis not present

## 2019-11-01 DIAGNOSIS — Z23 Encounter for immunization: Secondary | ICD-10-CM | POA: Diagnosis not present

## 2019-11-28 DIAGNOSIS — Z23 Encounter for immunization: Secondary | ICD-10-CM | POA: Diagnosis not present

## 2019-12-06 DIAGNOSIS — E119 Type 2 diabetes mellitus without complications: Secondary | ICD-10-CM | POA: Diagnosis not present

## 2019-12-06 DIAGNOSIS — E559 Vitamin D deficiency, unspecified: Secondary | ICD-10-CM | POA: Diagnosis not present

## 2019-12-06 DIAGNOSIS — Z7984 Long term (current) use of oral hypoglycemic drugs: Secondary | ICD-10-CM | POA: Diagnosis not present

## 2019-12-06 DIAGNOSIS — E890 Postprocedural hypothyroidism: Secondary | ICD-10-CM | POA: Diagnosis not present

## 2020-03-05 DIAGNOSIS — M79675 Pain in left toe(s): Secondary | ICD-10-CM | POA: Diagnosis not present

## 2020-03-05 DIAGNOSIS — T22222A Burn of second degree of left elbow, initial encounter: Secondary | ICD-10-CM | POA: Diagnosis not present

## 2020-03-05 DIAGNOSIS — Z23 Encounter for immunization: Secondary | ICD-10-CM | POA: Diagnosis not present

## 2020-03-14 DIAGNOSIS — Z1331 Encounter for screening for depression: Secondary | ICD-10-CM | POA: Diagnosis not present

## 2020-03-14 DIAGNOSIS — E785 Hyperlipidemia, unspecified: Secondary | ICD-10-CM | POA: Diagnosis not present

## 2020-03-14 DIAGNOSIS — D519 Vitamin B12 deficiency anemia, unspecified: Secondary | ICD-10-CM | POA: Diagnosis not present

## 2020-03-14 DIAGNOSIS — R600 Localized edema: Secondary | ICD-10-CM | POA: Diagnosis not present

## 2020-03-14 DIAGNOSIS — G4762 Sleep related leg cramps: Secondary | ICD-10-CM | POA: Diagnosis not present

## 2020-03-14 DIAGNOSIS — M5136 Other intervertebral disc degeneration, lumbar region: Secondary | ICD-10-CM | POA: Diagnosis not present

## 2020-03-14 DIAGNOSIS — E890 Postprocedural hypothyroidism: Secondary | ICD-10-CM | POA: Diagnosis not present

## 2020-03-14 DIAGNOSIS — E559 Vitamin D deficiency, unspecified: Secondary | ICD-10-CM | POA: Diagnosis not present

## 2020-03-14 DIAGNOSIS — E1142 Type 2 diabetes mellitus with diabetic polyneuropathy: Secondary | ICD-10-CM | POA: Diagnosis not present

## 2020-03-14 DIAGNOSIS — E1165 Type 2 diabetes mellitus with hyperglycemia: Secondary | ICD-10-CM | POA: Diagnosis not present

## 2020-06-06 DIAGNOSIS — E559 Vitamin D deficiency, unspecified: Secondary | ICD-10-CM | POA: Diagnosis not present

## 2020-06-06 DIAGNOSIS — E890 Postprocedural hypothyroidism: Secondary | ICD-10-CM | POA: Diagnosis not present

## 2020-06-06 DIAGNOSIS — E119 Type 2 diabetes mellitus without complications: Secondary | ICD-10-CM | POA: Diagnosis not present

## 2020-07-11 DIAGNOSIS — R21 Rash and other nonspecific skin eruption: Secondary | ICD-10-CM | POA: Diagnosis not present

## 2020-08-01 DIAGNOSIS — L853 Xerosis cutis: Secondary | ICD-10-CM | POA: Diagnosis not present

## 2020-08-01 DIAGNOSIS — L299 Pruritus, unspecified: Secondary | ICD-10-CM | POA: Diagnosis not present

## 2020-08-01 DIAGNOSIS — L300 Nummular dermatitis: Secondary | ICD-10-CM | POA: Diagnosis not present

## 2020-08-13 DIAGNOSIS — Z23 Encounter for immunization: Secondary | ICD-10-CM | POA: Diagnosis not present

## 2020-10-07 DIAGNOSIS — E890 Postprocedural hypothyroidism: Secondary | ICD-10-CM | POA: Diagnosis not present

## 2020-10-07 DIAGNOSIS — E1142 Type 2 diabetes mellitus with diabetic polyneuropathy: Secondary | ICD-10-CM | POA: Diagnosis not present

## 2020-10-07 DIAGNOSIS — Z23 Encounter for immunization: Secondary | ICD-10-CM | POA: Diagnosis not present

## 2020-10-07 DIAGNOSIS — E785 Hyperlipidemia, unspecified: Secondary | ICD-10-CM | POA: Diagnosis not present

## 2020-10-07 DIAGNOSIS — G4762 Sleep related leg cramps: Secondary | ICD-10-CM | POA: Diagnosis not present

## 2020-10-07 DIAGNOSIS — Z1231 Encounter for screening mammogram for malignant neoplasm of breast: Secondary | ICD-10-CM | POA: Diagnosis not present

## 2020-10-07 DIAGNOSIS — R519 Headache, unspecified: Secondary | ICD-10-CM | POA: Diagnosis not present

## 2020-10-07 DIAGNOSIS — E1165 Type 2 diabetes mellitus with hyperglycemia: Secondary | ICD-10-CM | POA: Diagnosis not present

## 2020-10-07 DIAGNOSIS — R600 Localized edema: Secondary | ICD-10-CM | POA: Diagnosis not present

## 2020-10-07 DIAGNOSIS — Z1211 Encounter for screening for malignant neoplasm of colon: Secondary | ICD-10-CM | POA: Diagnosis not present

## 2020-10-07 DIAGNOSIS — D519 Vitamin B12 deficiency anemia, unspecified: Secondary | ICD-10-CM | POA: Diagnosis not present

## 2020-10-07 DIAGNOSIS — E559 Vitamin D deficiency, unspecified: Secondary | ICD-10-CM | POA: Diagnosis not present

## 2020-10-24 DIAGNOSIS — Z9181 History of falling: Secondary | ICD-10-CM | POA: Diagnosis not present

## 2020-10-24 DIAGNOSIS — E785 Hyperlipidemia, unspecified: Secondary | ICD-10-CM | POA: Diagnosis not present

## 2020-10-24 DIAGNOSIS — Z1331 Encounter for screening for depression: Secondary | ICD-10-CM | POA: Diagnosis not present

## 2020-10-24 DIAGNOSIS — Z0000 Encounter for general adult medical examination without abnormal findings: Secondary | ICD-10-CM | POA: Diagnosis not present

## 2020-10-25 ENCOUNTER — Telehealth: Payer: Self-pay | Admitting: Gastroenterology

## 2020-10-25 ENCOUNTER — Encounter: Payer: Self-pay | Admitting: Gastroenterology

## 2020-10-25 NOTE — Telephone Encounter (Signed)
Left message to call back to schedule recall colon.

## 2020-10-25 NOTE — Telephone Encounter (Signed)
Hi Kelly, could you please confirm when pt is due for a repeat colon? Her last one was with Dr. Chales Abrahams in 01/2012. Thank you.

## 2020-10-25 NOTE — Telephone Encounter (Signed)
Hi Cathy, I do not see anything indicating when she is due. But her last colonoscopy with Dr Chales Abrahams was in in 2013. She had sessile serrated polyps. By standard protocol she is due for  a repeat colonoscopy.

## 2020-12-19 ENCOUNTER — Encounter: Payer: Managed Care, Other (non HMO) | Admitting: Gastroenterology

## 2020-12-31 DIAGNOSIS — E119 Type 2 diabetes mellitus without complications: Secondary | ICD-10-CM | POA: Diagnosis not present

## 2020-12-31 DIAGNOSIS — E559 Vitamin D deficiency, unspecified: Secondary | ICD-10-CM | POA: Diagnosis not present

## 2020-12-31 DIAGNOSIS — E890 Postprocedural hypothyroidism: Secondary | ICD-10-CM | POA: Diagnosis not present

## 2021-01-08 DIAGNOSIS — R600 Localized edema: Secondary | ICD-10-CM | POA: Diagnosis not present

## 2021-01-08 DIAGNOSIS — E785 Hyperlipidemia, unspecified: Secondary | ICD-10-CM | POA: Diagnosis not present

## 2021-01-08 DIAGNOSIS — G4762 Sleep related leg cramps: Secondary | ICD-10-CM | POA: Diagnosis not present

## 2021-01-08 DIAGNOSIS — D519 Vitamin B12 deficiency anemia, unspecified: Secondary | ICD-10-CM | POA: Diagnosis not present

## 2021-01-08 DIAGNOSIS — E559 Vitamin D deficiency, unspecified: Secondary | ICD-10-CM | POA: Diagnosis not present

## 2021-01-08 DIAGNOSIS — E890 Postprocedural hypothyroidism: Secondary | ICD-10-CM | POA: Diagnosis not present

## 2021-01-08 DIAGNOSIS — M8589 Other specified disorders of bone density and structure, multiple sites: Secondary | ICD-10-CM | POA: Diagnosis not present

## 2021-01-15 ENCOUNTER — Other Ambulatory Visit: Payer: Self-pay

## 2021-01-15 ENCOUNTER — Telehealth: Payer: Self-pay | Admitting: *Deleted

## 2021-01-15 ENCOUNTER — Ambulatory Visit (AMBULATORY_SURGERY_CENTER): Payer: Medicare Other | Admitting: *Deleted

## 2021-01-15 VITALS — Ht 69.0 in | Wt 188.0 lb

## 2021-01-15 DIAGNOSIS — Z86010 Personal history of colonic polyps: Secondary | ICD-10-CM

## 2021-01-15 MED ORDER — NA SULFATE-K SULFATE-MG SULF 17.5-3.13-1.6 GM/177ML PO SOLN: 1.0000 | Freq: Once | ORAL | 0 refills | Status: AC

## 2021-01-15 NOTE — Telephone Encounter (Signed)
Virtual pre visit completed.  

## 2021-01-15 NOTE — Progress Notes (Addendum)
No egg or soy allergy known to patient  No issues with past sedation with any surgeries or procedures Patient denies ever being told they had issues or difficulty with intubation  No FH of Malignant Hyperthermia No diet pills per patient No home 02 use per patient  No blood thinners per patient  Pt denies issues with constipation  No A fib or A flutter  EMMI video to pt or via MyChart  COVID 19 guidelines implemented in [facility] today with Pt and RN   Virtual pre visit completed. Instructions mailed to patient.    Discussed with pt there will be an out-of-pocket cost for prep and that varies from $0 to 70 dollars   Due to the COVID-19 pandemic we are asking patients to follow certain guidelines.  Pt aware of COVID protocols and [facility] guidelines  

## 2021-01-24 ENCOUNTER — Telehealth: Payer: Self-pay | Admitting: Gastroenterology

## 2021-01-24 DIAGNOSIS — Z1231 Encounter for screening mammogram for malignant neoplasm of breast: Secondary | ICD-10-CM | POA: Diagnosis not present

## 2021-01-24 DIAGNOSIS — M8589 Other specified disorders of bone density and structure, multiple sites: Secondary | ICD-10-CM | POA: Diagnosis not present

## 2021-01-24 NOTE — Telephone Encounter (Signed)
Spoke with patient, advised if she is not feeling well it would be best to reschedule her appt because the prep may make her feel a bit worse. Patient has been reschedule to Wednesday, 04/02/21 at 11:30 am, arriving at 10:30 am with a care partner. Updated instructions mailed to patient. Patient verbalized understanding and had no concerns at the end of the call.

## 2021-01-29 ENCOUNTER — Encounter: Payer: Managed Care, Other (non HMO) | Admitting: Gastroenterology

## 2021-01-31 ENCOUNTER — Encounter: Payer: Managed Care, Other (non HMO) | Admitting: Gastroenterology

## 2021-02-05 DIAGNOSIS — J019 Acute sinusitis, unspecified: Secondary | ICD-10-CM | POA: Diagnosis not present

## 2021-02-05 DIAGNOSIS — J208 Acute bronchitis due to other specified organisms: Secondary | ICD-10-CM | POA: Diagnosis not present

## 2021-02-05 DIAGNOSIS — B9689 Other specified bacterial agents as the cause of diseases classified elsewhere: Secondary | ICD-10-CM | POA: Diagnosis not present

## 2021-04-01 ENCOUNTER — Telehealth: Payer: Self-pay | Admitting: Gastroenterology

## 2021-04-01 NOTE — Telephone Encounter (Addendum)
Good morning Dr. Chales Abrahams, patient called stating they are not feeling well so they canceled their procedure scheduled for 04/02/2021.  She will call back at a later date to reschedule.

## 2021-04-01 NOTE — Telephone Encounter (Signed)
Thanks for letting me know RG 

## 2021-04-02 ENCOUNTER — Encounter: Payer: Medicare Other | Admitting: Gastroenterology

## 2021-04-10 DIAGNOSIS — E890 Postprocedural hypothyroidism: Secondary | ICD-10-CM | POA: Diagnosis not present

## 2021-04-10 DIAGNOSIS — R600 Localized edema: Secondary | ICD-10-CM | POA: Diagnosis not present

## 2021-04-10 DIAGNOSIS — E1142 Type 2 diabetes mellitus with diabetic polyneuropathy: Secondary | ICD-10-CM | POA: Diagnosis not present

## 2021-04-10 DIAGNOSIS — D519 Vitamin B12 deficiency anemia, unspecified: Secondary | ICD-10-CM | POA: Diagnosis not present

## 2021-04-10 DIAGNOSIS — E785 Hyperlipidemia, unspecified: Secondary | ICD-10-CM | POA: Diagnosis not present

## 2021-04-10 DIAGNOSIS — E559 Vitamin D deficiency, unspecified: Secondary | ICD-10-CM | POA: Diagnosis not present

## 2021-04-10 DIAGNOSIS — Z1211 Encounter for screening for malignant neoplasm of colon: Secondary | ICD-10-CM | POA: Diagnosis not present

## 2021-04-10 DIAGNOSIS — G4762 Sleep related leg cramps: Secondary | ICD-10-CM | POA: Diagnosis not present

## 2021-05-19 DIAGNOSIS — Z1211 Encounter for screening for malignant neoplasm of colon: Secondary | ICD-10-CM | POA: Diagnosis not present

## 2021-05-22 DIAGNOSIS — M722 Plantar fascial fibromatosis: Secondary | ICD-10-CM | POA: Diagnosis not present

## 2021-07-11 DIAGNOSIS — D519 Vitamin B12 deficiency anemia, unspecified: Secondary | ICD-10-CM | POA: Diagnosis not present

## 2021-07-11 DIAGNOSIS — E785 Hyperlipidemia, unspecified: Secondary | ICD-10-CM | POA: Diagnosis not present

## 2021-07-11 DIAGNOSIS — E890 Postprocedural hypothyroidism: Secondary | ICD-10-CM | POA: Diagnosis not present

## 2021-07-11 DIAGNOSIS — E1142 Type 2 diabetes mellitus with diabetic polyneuropathy: Secondary | ICD-10-CM | POA: Diagnosis not present

## 2021-07-11 DIAGNOSIS — R600 Localized edema: Secondary | ICD-10-CM | POA: Diagnosis not present

## 2021-07-11 DIAGNOSIS — G4762 Sleep related leg cramps: Secondary | ICD-10-CM | POA: Diagnosis not present

## 2021-07-11 DIAGNOSIS — E559 Vitamin D deficiency, unspecified: Secondary | ICD-10-CM | POA: Diagnosis not present

## 2021-09-22 DIAGNOSIS — B9689 Other specified bacterial agents as the cause of diseases classified elsewhere: Secondary | ICD-10-CM | POA: Diagnosis not present

## 2021-09-22 DIAGNOSIS — J019 Acute sinusitis, unspecified: Secondary | ICD-10-CM | POA: Diagnosis not present

## 2021-09-22 DIAGNOSIS — J208 Acute bronchitis due to other specified organisms: Secondary | ICD-10-CM | POA: Diagnosis not present

## 2021-09-22 DIAGNOSIS — H60311 Diffuse otitis externa, right ear: Secondary | ICD-10-CM | POA: Diagnosis not present

## 2021-10-11 DIAGNOSIS — E559 Vitamin D deficiency, unspecified: Secondary | ICD-10-CM | POA: Diagnosis not present

## 2021-10-11 DIAGNOSIS — Z6828 Body mass index (BMI) 28.0-28.9, adult: Secondary | ICD-10-CM | POA: Diagnosis not present

## 2021-10-11 DIAGNOSIS — R600 Localized edema: Secondary | ICD-10-CM | POA: Diagnosis not present

## 2021-10-11 DIAGNOSIS — D519 Vitamin B12 deficiency anemia, unspecified: Secondary | ICD-10-CM | POA: Diagnosis not present

## 2021-10-11 DIAGNOSIS — E785 Hyperlipidemia, unspecified: Secondary | ICD-10-CM | POA: Diagnosis not present

## 2021-10-11 DIAGNOSIS — E890 Postprocedural hypothyroidism: Secondary | ICD-10-CM | POA: Diagnosis not present

## 2021-10-11 DIAGNOSIS — Z1211 Encounter for screening for malignant neoplasm of colon: Secondary | ICD-10-CM | POA: Diagnosis not present

## 2021-10-11 DIAGNOSIS — E1142 Type 2 diabetes mellitus with diabetic polyneuropathy: Secondary | ICD-10-CM | POA: Diagnosis not present

## 2022-01-13 DIAGNOSIS — R600 Localized edema: Secondary | ICD-10-CM | POA: Diagnosis not present

## 2022-01-13 DIAGNOSIS — E1142 Type 2 diabetes mellitus with diabetic polyneuropathy: Secondary | ICD-10-CM | POA: Diagnosis not present

## 2022-01-13 DIAGNOSIS — D519 Vitamin B12 deficiency anemia, unspecified: Secondary | ICD-10-CM | POA: Diagnosis not present

## 2022-01-13 DIAGNOSIS — M797 Fibromyalgia: Secondary | ICD-10-CM | POA: Diagnosis not present

## 2022-01-13 DIAGNOSIS — E785 Hyperlipidemia, unspecified: Secondary | ICD-10-CM | POA: Diagnosis not present

## 2022-01-13 DIAGNOSIS — E559 Vitamin D deficiency, unspecified: Secondary | ICD-10-CM | POA: Diagnosis not present

## 2022-01-13 DIAGNOSIS — Z1231 Encounter for screening mammogram for malignant neoplasm of breast: Secondary | ICD-10-CM | POA: Diagnosis not present

## 2022-01-13 DIAGNOSIS — E890 Postprocedural hypothyroidism: Secondary | ICD-10-CM | POA: Diagnosis not present

## 2022-02-23 DIAGNOSIS — E119 Type 2 diabetes mellitus without complications: Secondary | ICD-10-CM | POA: Diagnosis not present

## 2022-02-23 DIAGNOSIS — E890 Postprocedural hypothyroidism: Secondary | ICD-10-CM | POA: Diagnosis not present

## 2022-02-23 DIAGNOSIS — E559 Vitamin D deficiency, unspecified: Secondary | ICD-10-CM | POA: Diagnosis not present

## 2022-02-23 DIAGNOSIS — Z7984 Long term (current) use of oral hypoglycemic drugs: Secondary | ICD-10-CM | POA: Diagnosis not present

## 2022-03-24 DIAGNOSIS — Z1231 Encounter for screening mammogram for malignant neoplasm of breast: Secondary | ICD-10-CM | POA: Diagnosis not present

## 2022-04-16 DIAGNOSIS — E559 Vitamin D deficiency, unspecified: Secondary | ICD-10-CM | POA: Diagnosis not present

## 2022-04-16 DIAGNOSIS — Z1331 Encounter for screening for depression: Secondary | ICD-10-CM | POA: Diagnosis not present

## 2022-04-16 DIAGNOSIS — E1142 Type 2 diabetes mellitus with diabetic polyneuropathy: Secondary | ICD-10-CM | POA: Diagnosis not present

## 2022-04-16 DIAGNOSIS — G4762 Sleep related leg cramps: Secondary | ICD-10-CM | POA: Diagnosis not present

## 2022-04-16 DIAGNOSIS — E890 Postprocedural hypothyroidism: Secondary | ICD-10-CM | POA: Diagnosis not present

## 2022-04-16 DIAGNOSIS — D519 Vitamin B12 deficiency anemia, unspecified: Secondary | ICD-10-CM | POA: Diagnosis not present

## 2022-04-16 DIAGNOSIS — E785 Hyperlipidemia, unspecified: Secondary | ICD-10-CM | POA: Diagnosis not present

## 2022-07-17 DIAGNOSIS — E785 Hyperlipidemia, unspecified: Secondary | ICD-10-CM | POA: Diagnosis not present

## 2022-07-17 DIAGNOSIS — E1142 Type 2 diabetes mellitus with diabetic polyneuropathy: Secondary | ICD-10-CM | POA: Diagnosis not present

## 2022-07-17 DIAGNOSIS — M797 Fibromyalgia: Secondary | ICD-10-CM | POA: Diagnosis not present

## 2022-07-17 DIAGNOSIS — E890 Postprocedural hypothyroidism: Secondary | ICD-10-CM | POA: Diagnosis not present

## 2022-07-17 DIAGNOSIS — D519 Vitamin B12 deficiency anemia, unspecified: Secondary | ICD-10-CM | POA: Diagnosis not present

## 2022-07-17 DIAGNOSIS — E559 Vitamin D deficiency, unspecified: Secondary | ICD-10-CM | POA: Diagnosis not present

## 2022-07-17 DIAGNOSIS — Z23 Encounter for immunization: Secondary | ICD-10-CM | POA: Diagnosis not present

## 2022-07-17 DIAGNOSIS — G4762 Sleep related leg cramps: Secondary | ICD-10-CM | POA: Diagnosis not present

## 2022-07-17 DIAGNOSIS — R600 Localized edema: Secondary | ICD-10-CM | POA: Diagnosis not present

## 2022-08-07 DIAGNOSIS — M940 Chondrocostal junction syndrome [Tietze]: Secondary | ICD-10-CM | POA: Diagnosis not present

## 2022-08-07 DIAGNOSIS — E785 Hyperlipidemia, unspecified: Secondary | ICD-10-CM | POA: Diagnosis not present

## 2022-08-07 DIAGNOSIS — R079 Chest pain, unspecified: Secondary | ICD-10-CM | POA: Diagnosis not present

## 2022-08-17 DIAGNOSIS — J019 Acute sinusitis, unspecified: Secondary | ICD-10-CM | POA: Diagnosis not present

## 2022-09-21 DIAGNOSIS — K219 Gastro-esophageal reflux disease without esophagitis: Secondary | ICD-10-CM | POA: Diagnosis not present

## 2022-09-30 DIAGNOSIS — K449 Diaphragmatic hernia without obstruction or gangrene: Secondary | ICD-10-CM | POA: Diagnosis not present

## 2022-09-30 DIAGNOSIS — Z1211 Encounter for screening for malignant neoplasm of colon: Secondary | ICD-10-CM | POA: Diagnosis not present

## 2022-09-30 DIAGNOSIS — K219 Gastro-esophageal reflux disease without esophagitis: Secondary | ICD-10-CM | POA: Diagnosis not present

## 2022-09-30 DIAGNOSIS — E119 Type 2 diabetes mellitus without complications: Secondary | ICD-10-CM | POA: Diagnosis not present

## 2022-11-18 DIAGNOSIS — E785 Hyperlipidemia, unspecified: Secondary | ICD-10-CM | POA: Diagnosis not present

## 2022-11-18 DIAGNOSIS — E559 Vitamin D deficiency, unspecified: Secondary | ICD-10-CM | POA: Diagnosis not present

## 2022-11-18 DIAGNOSIS — E890 Postprocedural hypothyroidism: Secondary | ICD-10-CM | POA: Diagnosis not present

## 2022-11-18 DIAGNOSIS — D519 Vitamin B12 deficiency anemia, unspecified: Secondary | ICD-10-CM | POA: Diagnosis not present

## 2022-11-18 DIAGNOSIS — E1142 Type 2 diabetes mellitus with diabetic polyneuropathy: Secondary | ICD-10-CM | POA: Diagnosis not present

## 2022-11-19 DIAGNOSIS — K219 Gastro-esophageal reflux disease without esophagitis: Secondary | ICD-10-CM | POA: Diagnosis not present

## 2023-01-19 DIAGNOSIS — S83282A Other tear of lateral meniscus, current injury, left knee, initial encounter: Secondary | ICD-10-CM | POA: Diagnosis not present

## 2023-02-19 DIAGNOSIS — M5136 Other intervertebral disc degeneration, lumbar region: Secondary | ICD-10-CM | POA: Diagnosis not present

## 2023-02-19 DIAGNOSIS — R600 Localized edema: Secondary | ICD-10-CM | POA: Diagnosis not present

## 2023-02-19 DIAGNOSIS — E559 Vitamin D deficiency, unspecified: Secondary | ICD-10-CM | POA: Diagnosis not present

## 2023-02-19 DIAGNOSIS — E890 Postprocedural hypothyroidism: Secondary | ICD-10-CM | POA: Diagnosis not present

## 2023-02-19 DIAGNOSIS — E785 Hyperlipidemia, unspecified: Secondary | ICD-10-CM | POA: Diagnosis not present

## 2023-02-19 DIAGNOSIS — G4762 Sleep related leg cramps: Secondary | ICD-10-CM | POA: Diagnosis not present

## 2023-02-19 DIAGNOSIS — M797 Fibromyalgia: Secondary | ICD-10-CM | POA: Diagnosis not present

## 2023-02-19 DIAGNOSIS — E1142 Type 2 diabetes mellitus with diabetic polyneuropathy: Secondary | ICD-10-CM | POA: Diagnosis not present

## 2023-02-19 DIAGNOSIS — D519 Vitamin B12 deficiency anemia, unspecified: Secondary | ICD-10-CM | POA: Diagnosis not present

## 2023-02-20 DIAGNOSIS — Z043 Encounter for examination and observation following other accident: Secondary | ICD-10-CM | POA: Diagnosis not present

## 2023-02-20 DIAGNOSIS — M25562 Pain in left knee: Secondary | ICD-10-CM | POA: Diagnosis not present

## 2023-02-20 DIAGNOSIS — R55 Syncope and collapse: Secondary | ICD-10-CM | POA: Diagnosis not present

## 2023-03-09 DIAGNOSIS — R9431 Abnormal electrocardiogram [ECG] [EKG]: Secondary | ICD-10-CM | POA: Diagnosis not present

## 2023-03-09 DIAGNOSIS — I4510 Unspecified right bundle-branch block: Secondary | ICD-10-CM | POA: Diagnosis not present

## 2023-03-09 DIAGNOSIS — R001 Bradycardia, unspecified: Secondary | ICD-10-CM | POA: Diagnosis not present

## 2023-03-09 DIAGNOSIS — R55 Syncope and collapse: Secondary | ICD-10-CM | POA: Diagnosis not present

## 2023-03-09 DIAGNOSIS — E119 Type 2 diabetes mellitus without complications: Secondary | ICD-10-CM | POA: Diagnosis not present

## 2023-03-31 DIAGNOSIS — M6281 Muscle weakness (generalized): Secondary | ICD-10-CM | POA: Diagnosis not present

## 2023-03-31 DIAGNOSIS — M2560 Stiffness of unspecified joint, not elsewhere classified: Secondary | ICD-10-CM | POA: Diagnosis not present

## 2023-03-31 DIAGNOSIS — M545 Low back pain, unspecified: Secondary | ICD-10-CM | POA: Diagnosis not present

## 2023-04-06 DIAGNOSIS — M6281 Muscle weakness (generalized): Secondary | ICD-10-CM | POA: Diagnosis not present

## 2023-04-06 DIAGNOSIS — M545 Low back pain, unspecified: Secondary | ICD-10-CM | POA: Diagnosis not present

## 2023-04-06 DIAGNOSIS — M2560 Stiffness of unspecified joint, not elsewhere classified: Secondary | ICD-10-CM | POA: Diagnosis not present

## 2023-04-13 DIAGNOSIS — M2560 Stiffness of unspecified joint, not elsewhere classified: Secondary | ICD-10-CM | POA: Diagnosis not present

## 2023-04-13 DIAGNOSIS — M6281 Muscle weakness (generalized): Secondary | ICD-10-CM | POA: Diagnosis not present

## 2023-04-13 DIAGNOSIS — M545 Low back pain, unspecified: Secondary | ICD-10-CM | POA: Diagnosis not present

## 2023-04-15 DIAGNOSIS — M545 Low back pain, unspecified: Secondary | ICD-10-CM | POA: Diagnosis not present

## 2023-04-15 DIAGNOSIS — M6281 Muscle weakness (generalized): Secondary | ICD-10-CM | POA: Diagnosis not present

## 2023-04-15 DIAGNOSIS — M2560 Stiffness of unspecified joint, not elsewhere classified: Secondary | ICD-10-CM | POA: Diagnosis not present

## 2023-04-20 DIAGNOSIS — M6281 Muscle weakness (generalized): Secondary | ICD-10-CM | POA: Diagnosis not present

## 2023-04-20 DIAGNOSIS — M2560 Stiffness of unspecified joint, not elsewhere classified: Secondary | ICD-10-CM | POA: Diagnosis not present

## 2023-04-20 DIAGNOSIS — M545 Low back pain, unspecified: Secondary | ICD-10-CM | POA: Diagnosis not present

## 2023-05-21 DIAGNOSIS — M5136 Other intervertebral disc degeneration, lumbar region: Secondary | ICD-10-CM | POA: Diagnosis not present

## 2023-05-21 DIAGNOSIS — M797 Fibromyalgia: Secondary | ICD-10-CM | POA: Diagnosis not present

## 2023-05-21 DIAGNOSIS — R600 Localized edema: Secondary | ICD-10-CM | POA: Diagnosis not present

## 2023-05-21 DIAGNOSIS — E785 Hyperlipidemia, unspecified: Secondary | ICD-10-CM | POA: Diagnosis not present

## 2023-05-21 DIAGNOSIS — G4762 Sleep related leg cramps: Secondary | ICD-10-CM | POA: Diagnosis not present

## 2023-05-21 DIAGNOSIS — Z6826 Body mass index (BMI) 26.0-26.9, adult: Secondary | ICD-10-CM | POA: Diagnosis not present

## 2023-05-21 DIAGNOSIS — E1142 Type 2 diabetes mellitus with diabetic polyneuropathy: Secondary | ICD-10-CM | POA: Diagnosis not present

## 2023-05-21 DIAGNOSIS — E559 Vitamin D deficiency, unspecified: Secondary | ICD-10-CM | POA: Diagnosis not present

## 2023-05-21 DIAGNOSIS — E890 Postprocedural hypothyroidism: Secondary | ICD-10-CM | POA: Diagnosis not present

## 2023-05-21 DIAGNOSIS — Z1331 Encounter for screening for depression: Secondary | ICD-10-CM | POA: Diagnosis not present

## 2023-05-21 DIAGNOSIS — D519 Vitamin B12 deficiency anemia, unspecified: Secondary | ICD-10-CM | POA: Diagnosis not present

## 2023-08-09 DIAGNOSIS — M25561 Pain in right knee: Secondary | ICD-10-CM | POA: Diagnosis not present

## 2023-08-09 DIAGNOSIS — M25461 Effusion, right knee: Secondary | ICD-10-CM | POA: Diagnosis not present

## 2023-08-24 DIAGNOSIS — G4762 Sleep related leg cramps: Secondary | ICD-10-CM | POA: Diagnosis not present

## 2023-08-24 DIAGNOSIS — E559 Vitamin D deficiency, unspecified: Secondary | ICD-10-CM | POA: Diagnosis not present

## 2023-08-24 DIAGNOSIS — D519 Vitamin B12 deficiency anemia, unspecified: Secondary | ICD-10-CM | POA: Diagnosis not present

## 2023-08-24 DIAGNOSIS — E785 Hyperlipidemia, unspecified: Secondary | ICD-10-CM | POA: Diagnosis not present

## 2023-08-24 DIAGNOSIS — E890 Postprocedural hypothyroidism: Secondary | ICD-10-CM | POA: Diagnosis not present

## 2023-08-24 DIAGNOSIS — M51369 Other intervertebral disc degeneration, lumbar region without mention of lumbar back pain or lower extremity pain: Secondary | ICD-10-CM | POA: Diagnosis not present

## 2023-08-24 DIAGNOSIS — E1142 Type 2 diabetes mellitus with diabetic polyneuropathy: Secondary | ICD-10-CM | POA: Diagnosis not present

## 2023-08-24 DIAGNOSIS — R600 Localized edema: Secondary | ICD-10-CM | POA: Diagnosis not present

## 2023-08-24 DIAGNOSIS — J4530 Mild persistent asthma, uncomplicated: Secondary | ICD-10-CM | POA: Diagnosis not present

## 2023-08-24 DIAGNOSIS — M797 Fibromyalgia: Secondary | ICD-10-CM | POA: Diagnosis not present

## 2023-09-08 DIAGNOSIS — M25561 Pain in right knee: Secondary | ICD-10-CM | POA: Diagnosis not present
